# Patient Record
Sex: Female | Born: 1970
Health system: Southern US, Community
[De-identification: ages and names within clinical notes are randomized; demographics above are authoritative.]

## PROBLEM LIST (undated history)

## (undated) DIAGNOSIS — I10 Essential (primary) hypertension: Secondary | ICD-10-CM

## (undated) DIAGNOSIS — F329 Major depressive disorder, single episode, unspecified: Secondary | ICD-10-CM

## (undated) DIAGNOSIS — E349 Endocrine disorder, unspecified: Secondary | ICD-10-CM

## (undated) DIAGNOSIS — E785 Hyperlipidemia, unspecified: Secondary | ICD-10-CM

## (undated) DIAGNOSIS — I340 Nonrheumatic mitral (valve) insufficiency: Secondary | ICD-10-CM

## (undated) DIAGNOSIS — F419 Anxiety disorder, unspecified: Secondary | ICD-10-CM

## (undated) DIAGNOSIS — F32A Depression, unspecified: Secondary | ICD-10-CM

## (undated) DIAGNOSIS — E282 Polycystic ovarian syndrome: Secondary | ICD-10-CM

## (undated) DIAGNOSIS — E119 Type 2 diabetes mellitus without complications: Secondary | ICD-10-CM

## (undated) DIAGNOSIS — T7840XA Allergy, unspecified, initial encounter: Secondary | ICD-10-CM

## (undated) DIAGNOSIS — R011 Cardiac murmur, unspecified: Secondary | ICD-10-CM

## (undated) DIAGNOSIS — G4730 Sleep apnea, unspecified: Secondary | ICD-10-CM

## (undated) DIAGNOSIS — J42 Unspecified chronic bronchitis: Secondary | ICD-10-CM

## (undated) HISTORY — DX: Nonrheumatic mitral (valve) insufficiency: I34.0

## (undated) HISTORY — DX: Essential (primary) hypertension: I10

## (undated) HISTORY — DX: Allergy, unspecified, initial encounter: T78.40XA

## (undated) HISTORY — DX: Hyperlipidemia, unspecified: E78.5

## (undated) HISTORY — DX: Anxiety disorder, unspecified: F41.9

## (undated) HISTORY — DX: Endocrine disorder, unspecified: E34.9

## (undated) HISTORY — DX: Cardiac murmur, unspecified: R01.1

## (undated) HISTORY — DX: Major depressive disorder, single episode, unspecified: F32.9

## (undated) HISTORY — DX: Depression, unspecified: F32.A

## (undated) HISTORY — PX: CHOLECYSTECTOMY: SHX55

---

## 2006-05-03 ENCOUNTER — Encounter: Payer: Self-pay | Admitting: Family Medicine

## 2006-05-03 LAB — CONVERTED CEMR LAB: Hgb A1c MFr Bld: 5.4 %

## 2007-02-13 ENCOUNTER — Ambulatory Visit: Payer: Self-pay | Admitting: Family Medicine

## 2007-02-13 DIAGNOSIS — K5900 Constipation, unspecified: Secondary | ICD-10-CM | POA: Insufficient documentation

## 2007-02-13 DIAGNOSIS — E282 Polycystic ovarian syndrome: Secondary | ICD-10-CM | POA: Insufficient documentation

## 2007-02-20 ENCOUNTER — Encounter: Payer: Self-pay | Admitting: Family Medicine

## 2007-02-20 DIAGNOSIS — E781 Pure hyperglyceridemia: Secondary | ICD-10-CM | POA: Insufficient documentation

## 2014-10-13 ENCOUNTER — Encounter: Payer: Self-pay | Admitting: *Deleted

## 2014-10-13 ENCOUNTER — Emergency Department
Admission: EM | Admit: 2014-10-13 | Discharge: 2014-10-13 | Disposition: A | Discharge status: Home / Self Care | Payer: BLUE CROSS/BLUE SHIELD | Source: Home / Self Care | Attending: Emergency Medicine | Admitting: Emergency Medicine

## 2014-10-13 DIAGNOSIS — J208 Acute bronchitis due to other specified organisms: Secondary | ICD-10-CM | POA: Diagnosis not present

## 2014-10-13 HISTORY — DX: Polycystic ovarian syndrome: E28.2

## 2014-10-13 HISTORY — DX: Type 2 diabetes mellitus without complications: E11.9

## 2014-10-13 MED ORDER — ALBUTEROL SULFATE HFA 108 (90 BASE) MCG/ACT IN AERS: 1.0000 | INHALATION_SPRAY | Freq: Four times a day (QID) | RESPIRATORY_TRACT | Status: DC | PRN

## 2014-10-13 MED ORDER — AZITHROMYCIN 250 MG PO TABS: 250.0000 mg | ORAL_TABLET | Freq: Every day | ORAL | Status: DC

## 2014-10-13 NOTE — ED Provider Notes (Signed)
CSN: [id number]     Arrival date & time 10/13/14  1010 History   First MD Initiated Contact with Patient 10/13/14 1051     Chief Complaint  Patient presents with  . Cough   (Consider location/radiation/quality/duration/timing/severity/associated sxs/prior Treatment) Patient is a 44 y.o. female presenting with cough. The history is provided by the patient. No language interpreter was used.  Cough Cough characteristics:  Productive Sputum characteristics:  Nondescript Severity:  Moderate Onset quality:  Gradual Duration:  3 days Timing:  Constant Progression:  Worsening Chronicity:  New Smoker: no   Context: upper respiratory infection   Relieved by:  Nothing Worsened by:  Nothing tried Ineffective treatments:  None tried Associated symptoms: shortness of breath and sinus congestion     Past Medical History  Diagnosis Date  . Diabetes mellitus without complication   . PCOS (polycystic ovarian syndrome)    Past Surgical History  Procedure Laterality Date  . Cholecystectomy     Family History  Problem Relation Age of Onset  . Heart attack Mother   . Heart attack Father   . Heart failure Brother   . Diabetes Brother    History  Substance Use Topics  . Smoking status: Former Smoker    Quit date: 10/13/2013  . Smokeless tobacco: Never Used  . Alcohol Use: No   OB History    No data available     Review of Systems  Respiratory: Positive for cough and shortness of breath.   All other systems reviewed and are negative.   Allergies  Review of patient's allergies indicates no known allergies.  Home Medications   Prior to Admission medications   Medication Sig Start Date End Date Taking? Authorizing Provider  albuterol (PROVENTIL HFA;VENTOLIN HFA) 108 (90 BASE) MCG/ACT inhaler Inhale 1-2 puffs into the lungs every 6 (six) hours as needed for wheezing or shortness of breath. 10/13/14   Elson Areas, PA-C  azithromycin (ZITHROMAX) 250 MG tablet Take 1 tablet (250 mg  total) by mouth daily. Take first 2 tablets together, then 1 every day until finished. 10/13/14   Elson Areas, PA-C   BP 135/87 mmHg  Pulse 84  Temp(Src) 98 F (36.7 C) (Oral)  Resp 16  Ht  (1.549 m)  Wt 228 lb (103.42 kg)  BMI 43.10 kg/m2  SpO2 99%  LMP 08/07/2014 (Approximate) Physical Exam  Constitutional: She is oriented to person, place, and time. She appears well-developed and well-nourished.  HENT:  Head: Normocephalic and atraumatic.  Right Ear: External ear normal.  Left Ear: External ear normal.  Nose: Nose normal.  Mouth/Throat: Oropharynx is clear and moist.  Eyes: Conjunctivae and EOM are normal. Pupils are equal, round, and reactive to light.  Neck: Normal range of motion.  Cardiovascular: Normal rate and regular rhythm.   Pulmonary/Chest: Effort normal and breath sounds normal.  Abdominal: Soft. She exhibits no distension.  Musculoskeletal: Normal range of motion.  Neurological: She is alert and oriented to person, place, and time.  Skin: Skin is warm.  Psychiatric: She has a normal mood and affect.  Nursing note and vitals reviewed.   ED Course  Procedures (including critical care time) Labs Review Labs Reviewed - No data to display  Imaging Review No results found.   MDM   1. Acute bronchitis due to other specified organisms    Albuterol zithromax Return if any problems. AVS   Elson Areas, PA-C 10/13/14 1400

## 2014-10-13 NOTE — ED Notes (Signed)
Arayah c/o wet non-productive cough, congestion, body aches and sneezing x 3 days.

## 2014-10-13 NOTE — Discharge Instructions (Signed)

## 2014-12-11 ENCOUNTER — Emergency Department
Admission: EM | Admit: 2014-12-11 | Discharge: 2014-12-11 | Disposition: A | Discharge status: Home / Self Care | Payer: BLUE CROSS/BLUE SHIELD | Source: Home / Self Care | Attending: Emergency Medicine | Admitting: Emergency Medicine

## 2014-12-11 ENCOUNTER — Encounter: Payer: Self-pay | Admitting: Emergency Medicine

## 2014-12-11 DIAGNOSIS — H65191 Other acute nonsuppurative otitis media, right ear: Secondary | ICD-10-CM | POA: Diagnosis not present

## 2014-12-11 DIAGNOSIS — H6091 Unspecified otitis externa, right ear: Secondary | ICD-10-CM

## 2014-12-11 DIAGNOSIS — H1031 Unspecified acute conjunctivitis, right eye: Secondary | ICD-10-CM | POA: Diagnosis not present

## 2014-12-11 DIAGNOSIS — H6121 Impacted cerumen, right ear: Secondary | ICD-10-CM

## 2014-12-11 MED ORDER — POLYMYXIN B-TRIMETHOPRIM 10000-0.1 UNIT/ML-% OP SOLN: OPHTHALMIC | Status: DC

## 2014-12-11 MED ORDER — CEFDINIR 300 MG PO CAPS: 300.0000 mg | ORAL_CAPSULE | Freq: Two times a day (BID) | ORAL | Status: DC

## 2014-12-11 MED ORDER — NEOMYCIN-POLYMYXIN-HC 3.5-10000-1 OT SUSP: 4.0000 [drp] | Freq: Three times a day (TID) | OTIC | Status: DC

## 2014-12-11 NOTE — ED Provider Notes (Signed)
CSN: [id number]     Arrival date & time 12/11/14  1828 History   First MD Initiated Contact with Patient 12/11/14 1836     Chief Complaint  Patient presents with  . Otalgia   (Consider location/radiation/quality/duration/timing/severity/associated sxs/prior Treatment) HPI URI HISTORY  Tenise is a 44 y.o. female who complains of onset of various cold symptoms and eye and ear symptoms for one week. Have been using over-the-counter treatment which helps a little bit.  No chills/sweats +  Fever  +  Nasal congestion +  Discolored Post-nasal drainage + mild sinus pain/pressure + mild sore throat  +  cough No wheezing No chest congestion No hemoptysis No shortness of breath No pleuritic pain  + red eyes, especially right with matted discharge + earache bilaterally, ears feel stopped up.  No nausea No vomiting No abdominal pain No diarrhea  No skin rashes +  Fatigue No myalgias No headache   Past Medical History  Diagnosis Date  . Diabetes mellitus without complication   . PCOS (polycystic ovarian syndrome)    Past Surgical History  Procedure Laterality Date  . Cholecystectomy     Family History  Problem Relation Age of Onset  . Heart attack Mother   . Heart attack Father   . Heart failure Brother   . Diabetes Brother    History  Substance Use Topics  . Smoking status: Former Smoker    Quit date: 10/13/2013  . Smokeless tobacco: Never Used  . Alcohol Use: No   OB History    No data available     Review of Systems  All other systems reviewed and are negative.   Allergies  Review of patient's allergies indicates not on file.  Home Medications   Prior to Admission medications   Medication Sig Start Date End Date Taking? Authorizing Provider  albuterol (PROVENTIL HFA;VENTOLIN HFA) 108 (90 BASE) MCG/ACT inhaler Inhale 1-2 puffs into the lungs every 6 (six) hours as needed for wheezing or shortness of breath. 10/13/14   Elson Areas, PA-C  azithromycin  (ZITHROMAX) 250 MG tablet Take 1 tablet (250 mg total) by mouth daily. Take first 2 tablets together, then 1 every day until finished. 10/13/14   Elson Areas, PA-C   BP 134/84 mmHg  Pulse 74  Temp(Src) 98.7 F (37.1 C) (Oral)  Ht  (1.575 m)  Wt 235 lb (106.595 kg)  BMI 42.97 kg/m2  SpO2 99% Physical Exam  Constitutional: She is oriented to person, place, and time. She appears well-developed and well-nourished. No distress.  HENT:  Head: Normocephalic and atraumatic.  Right Ear: Ear canal normal. Tympanic membrane is injected and erythematous.  Left Ear: Tympanic membrane and ear canal normal.  Nose: Mucosal edema and rhinorrhea present.  Mouth/Throat: Oropharynx is clear and moist. No oral lesions.  Posterior pharynx mildly red without exudate Left ear normal. Right ear with cerumen impaction. After lavage,and cerumen impaction was successfully removed, canal was swollen red but patent and right TM red but intact.  Eyes: No scleral icterus.  Conjunctiva mildly injected, slight yellow matter discharge R conjunctiva  Neck: Neck supple.  Cardiovascular: Normal rate, regular rhythm and normal heart sounds.   Pulmonary/Chest: Effort normal and breath sounds normal.  Lymphadenopathy:    She has no cervical adenopathy.  Neurological: She is alert and oriented to person, place, and time.  Skin: Skin is warm and dry.  Nursing note and vitals reviewed.   ED Course  Procedures (including critical care time) Labs Review  Labs Reviewed - No data to display  Imaging Review No results found.   MDM   1. Acute otitis media with effusion, right   2. Otitis external, right   3. Cerumen impaction, right   4. Acute conjunctivitis, right eye    pharyngitis/upper respiratory tract infection.   Treatment options discussed, as well as risks, benefits, alternatives. Patient voiced understanding and agreement with the following plans: Irrigated and lavaged right cerumen impaction with  good results. External canal slightly red and swollen when examined afterward. Right TM intact , red. Discharge Medication List as of 12/11/2014  7:23 PM    START taking these medications   Details  cefdinir (OMNICEF) 300 MG capsule Take 1 capsule (300 mg total) by mouth 2 (two) times daily. X 10 days, Starting 12/11/2014, Until Discontinued, Normal    neomycin-polymyxin-hydrocortisone (CORTISPORIN) 3.5-10000-1 otic suspension Place 4 drops into the right ear 3 (three) times daily., Starting 12/11/2014, Until Discontinued, Normal    trimethoprim-polymyxin b (POLYTRIM) ophthalmic solution 2 drop in affected eye(s) every 4 hours (while awake) x 7 days, Normal       Other symptomatic care discussed Follow-up with your primary care doctor in 5-7 days if not improving, or sooner if symptoms become worse. Precautions discussed. Red flags discussed. Questions invited and answered. Patient voiced understanding and agreement.    Lajean Manesavid Massey, MD 12/12/14 1210

## 2014-12-11 NOTE — ED Notes (Signed)
Rt ear pain, scratchy throat, sneezing x 1 week

## 2015-04-24 DIAGNOSIS — I152 Hypertension secondary to endocrine disorders: Secondary | ICD-10-CM | POA: Insufficient documentation

## 2015-04-24 DIAGNOSIS — E1159 Type 2 diabetes mellitus with other circulatory complications: Secondary | ICD-10-CM | POA: Insufficient documentation

## 2015-04-24 DIAGNOSIS — I10 Essential (primary) hypertension: Secondary | ICD-10-CM

## 2015-05-25 LAB — HM PAP SMEAR: HM Pap smear: NEGATIVE

## 2015-09-21 ENCOUNTER — Encounter: Payer: Self-pay | Admitting: Emergency Medicine

## 2015-09-21 ENCOUNTER — Emergency Department
Admission: EM | Admit: 2015-09-21 | Discharge: 2015-09-21 | Disposition: A | Discharge status: Home / Self Care | Payer: BLUE CROSS/BLUE SHIELD | Source: Home / Self Care | Attending: Family Medicine | Admitting: Family Medicine

## 2015-09-21 DIAGNOSIS — J069 Acute upper respiratory infection, unspecified: Secondary | ICD-10-CM | POA: Diagnosis not present

## 2015-09-21 MED ORDER — DM-GUAIFENESIN ER 30-600 MG PO TB12: 1.0000 | ORAL_TABLET | Freq: Two times a day (BID) | ORAL | Status: DC

## 2015-09-21 MED ORDER — AMOXICILLIN 875 MG PO TABS: 875.0000 mg | ORAL_TABLET | Freq: Two times a day (BID) | ORAL | Status: DC

## 2015-09-21 MED ORDER — FLUTICASONE PROPIONATE 50 MCG/ACT NA SUSP: 2.0000 | Freq: Every day | NASAL | Status: DC

## 2015-09-21 MED ORDER — BENZONATATE 100 MG PO CAPS: 100.0000 mg | ORAL_CAPSULE | Freq: Three times a day (TID) | ORAL | Status: DC

## 2015-09-21 NOTE — ED Provider Notes (Signed)
CSN: [id number]     Arrival date & time 09/21/15  1620 History   First MD Initiated Contact with Patient 09/21/15 1653     Chief Complaint  Patient presents with  . URI   (Consider location/radiation/quality/duration/timing/severity/associated sxs/prior Treatment) HPI Pt is a 44yo female presenting to [facility] with c/o 1 day hx of nasal congestion, sneezing, sinus pain, frontal headache with sinus pressure, sore throat, chills and body aches.  She has tried acetaminophen but no other medications for her symptoms. Pt states her fiance has also been sick but they do not live today.  Pt reports mild intermittent productive cough for 1 day. Nasal congestion and sore throat are most bothersome for patient.  Denies n/v/d. Denies difficulty breathing or swallowing.  Past Medical History  Diagnosis Date  . Diabetes mellitus without complication (HCC)   . PCOS (polycystic ovarian syndrome)    Past Surgical History  Procedure Laterality Date  . Cholecystectomy     Family History  Problem Relation Age of Onset  . Heart attack Mother   . Heart attack Father   . Heart failure Brother   . Diabetes Brother    Social History  Substance Use Topics  . Smoking status: Former Smoker    Quit date: 10/13/2013  . Smokeless tobacco: Never Used  . Alcohol Use: No   OB History    No data available     Review of Systems  Constitutional: Positive for fever and chills.  HENT: Positive for congestion, sinus pressure, sneezing and voice change. Negative for ear pain, sore throat and trouble swallowing.   Respiratory: Negative for cough and shortness of breath.   Cardiovascular: Negative for chest pain and palpitations.  Gastrointestinal: Negative for nausea, vomiting, abdominal pain and diarrhea.  Musculoskeletal: Negative for myalgias, back pain and arthralgias.  Skin: Negative for rash.  Neurological: Positive for headaches. Negative for dizziness and light-headedness.  All other systems reviewed and  are negative.   Allergies  Review of patient's allergies indicates no known allergies.  Home Medications   Prior to Admission medications   Medication Sig Start Date End Date Taking? Authorizing Provider  fenofibrate (TRICOR) 145 MG tablet Take 145 mg by mouth daily.   Yes Historical Provider, MD  lisinopril (PRINIVIL,ZESTRIL) 5 MG tablet Take 5 mg by mouth daily.   Yes Historical Provider, MD  amoxicillin (AMOXIL) 875 MG tablet Take 1 tablet (875 mg total) by mouth 2 (two) times daily. For 7 days 09/21/15   Junius Finner, PA-C  benzonatate (TESSALON) 100 MG capsule Take 1 capsule (100 mg total) by mouth every 8 (eight) hours. 09/21/15   Junius Finner, PA-C  cefdinir (OMNICEF) 300 MG capsule Take 1 capsule (300 mg total) by mouth 2 (two) times daily. X 10 days 12/11/14   Lajean Manes, MD  dextromethorphan-guaiFENesin [facility] DM) 30-600 MG 12hr tablet Take 1 tablet by mouth 2 (two) times daily. 09/21/15   Junius Finner, PA-C  fluticasone (FLONASE) 50 MCG/ACT nasal spray Place 2 sprays into both nostrils daily. 09/21/15   Junius Finner, PA-C  neomycin-polymyxin-hydrocortisone (CORTISPORIN) 3.5-10000-1 otic suspension Place 4 drops into the right ear 3 (three) times daily. 12/11/14   Lajean Manes, MD  trimethoprim-polymyxin b (POLYTRIM) ophthalmic solution 2 drop in affected eye(s) every 4 hours (while awake) x 7 days 12/11/14   Lajean Manes, MD   Meds Ordered and Administered this Visit  Medications - No data to display  BP 131/82 mmHg  Pulse 87  Temp(Src) 98.3 F (36.8 C) (Oral)  Ht 5\' 2"  (1.575 m)  Wt 256 lb (116.121 kg)  BMI 46.81 kg/m2  SpO2 99%  LMP 05/22/2015 No data found.   Physical Exam  Constitutional: She appears well-developed and well-nourished. No distress.  HENT:  Head: Normocephalic and atraumatic.  Right Ear: Hearing, tympanic membrane, external ear and ear canal normal.  Left Ear: Hearing, tympanic membrane, external ear and ear canal normal.  Nose: Mucosal edema  present. Right sinus exhibits frontal sinus tenderness. Right sinus exhibits no maxillary sinus tenderness. Left sinus exhibits frontal sinus tenderness. Left sinus exhibits no maxillary sinus tenderness.  Mouth/Throat: Uvula is midline, oropharynx is clear and moist and mucous membranes are normal.  Eyes: Conjunctivae are normal. No scleral icterus.  Neck: Normal range of motion. Neck supple.  Cardiovascular: Normal rate, regular rhythm and normal heart sounds.   Pulmonary/Chest: Effort normal and breath sounds normal. No respiratory distress. She has no wheezes. She has no rales. She exhibits no tenderness.  Abdominal: Soft. She exhibits no distension and no mass. There is no tenderness. There is no rebound and no guarding.  Musculoskeletal: Normal range of motion.  Lymphadenopathy:    She has no cervical adenopathy.  Neurological: She is alert.  Skin: Skin is warm and dry. She is not diaphoretic.  Nursing note and vitals reviewed.   ED Course  Procedures (including critical care time)  Labs Review Labs Reviewed - No data to display  Imaging Review No results found.    MDM   1. Acute upper respiratory infection    Pt c/o URI symptoms for 1 day. She has tried acetaminophen but no other home treatments. Pt is afebrile. Symptoms likely viral in nature.  Encouraged pt to try symptomatic treatment for at least 4-5 days, may fill antibiotic if symptoms continue to worsen.  Rx: Tessalon, Flonase, and Mucinex DM were e-prescribed.  Printed- amoxicillin 875mg , may fill in 1 week if symptoms continue to worsen.  Advised pt to use acetaminophen and ibuprofen as needed for fever and pain. Encouraged rest and fluids. F/u with PCP in 7-10 days if not improving, sooner if worsening. Pt verbalized understanding and agreement with tx plan.     Junius Finnerrin O'Malley, PA-C 09/21/15 1735

## 2015-09-21 NOTE — Discharge Instructions (Signed)
You may take 400-600mg  Ibuprofen (Motrin) every 6-8 hours for fever and pain  Alternate with Tylenol  You may take 500mg  Tylenol every 4-6 hours as needed for fever and pain  Follow-up with your primary care provider next week for recheck of symptoms if not improving.  Be sure to drink plenty of fluids and rest, at least 8hrs of sleep a night, preferably more while you are sick. Return urgent care or go to closest ER if you cannot keep down fluids/signs of dehydration, fever not reducing with Tylenol, difficulty breathing/wheezing, stiff neck, worsening condition, or other concerns (see below)   Please allow at least 4-5 days of symptomatic treatment to be started before filling antibiotic.  If symptoms continue to worsen, you may fill the antibiotic.   If you decide to fill the antibiotic, Please take antibiotics as prescribed and be sure to complete entire course even if you start to feel better to ensure infection does not come back.  Cool Mist Vaporizers Vaporizers may help relieve the symptoms of a cough and cold. They add moisture to the air, which helps mucus to become thinner and less sticky. This makes it easier to breathe and cough up secretions. Cool mist vaporizers do not cause serious burns like hot mist vaporizers, which may also be called steamers or humidifiers. Vaporizers have not been proven to help with colds. You should not use a vaporizer if you are allergic to mold. HOME CARE INSTRUCTIONS  Follow the package instructions for the vaporizer.  Do not use anything other than distilled water in the vaporizer.  Do not run the vaporizer all of the time. This can cause mold or bacteria to grow in the vaporizer.  Clean the vaporizer after each time it is used.  Clean and dry the vaporizer well before storing it.  Stop using the vaporizer if worsening respiratory symptoms develop.   This information is not intended to replace advice given to you by your health care provider.  Make sure you discuss any questions you have with your health care provider.   Document Released: 06/23/2004 Document Revised: 10/01/2013 Document Reviewed: 02/13/2013 Elsevier Interactive Patient Education [employer]2016 [employer]

## 2015-09-21 NOTE — ED Notes (Signed)
Cough, sneezing, congestion, sinus pain, pressure, headache, sore throat, chills, body aches started yesterday

## 2015-12-18 DIAGNOSIS — M25061 Hemarthrosis, right knee: Secondary | ICD-10-CM | POA: Insufficient documentation

## 2015-12-30 DIAGNOSIS — S83004D Unspecified dislocation of right patella, subsequent encounter: Secondary | ICD-10-CM | POA: Insufficient documentation

## 2016-01-13 DIAGNOSIS — M25561 Pain in right knee: Secondary | ICD-10-CM | POA: Diagnosis not present

## 2016-01-19 DIAGNOSIS — S83004D Unspecified dislocation of right patella, subsequent encounter: Secondary | ICD-10-CM | POA: Diagnosis not present

## 2016-01-20 DIAGNOSIS — M25561 Pain in right knee: Secondary | ICD-10-CM | POA: Diagnosis not present

## 2016-01-27 DIAGNOSIS — M25561 Pain in right knee: Secondary | ICD-10-CM | POA: Diagnosis not present

## 2016-02-18 DIAGNOSIS — S83004D Unspecified dislocation of right patella, subsequent encounter: Secondary | ICD-10-CM | POA: Diagnosis not present

## 2016-03-13 DIAGNOSIS — M25541 Pain in joints of right hand: Secondary | ICD-10-CM | POA: Diagnosis not present

## 2016-03-13 DIAGNOSIS — Z79899 Other long term (current) drug therapy: Secondary | ICD-10-CM | POA: Diagnosis not present

## 2016-03-13 DIAGNOSIS — G5601 Carpal tunnel syndrome, right upper limb: Secondary | ICD-10-CM | POA: Diagnosis not present

## 2016-03-13 DIAGNOSIS — Z791 Long term (current) use of non-steroidal anti-inflammatories (NSAID): Secondary | ICD-10-CM | POA: Diagnosis not present

## 2016-03-13 DIAGNOSIS — R2231 Localized swelling, mass and lump, right upper limb: Secondary | ICD-10-CM | POA: Diagnosis not present

## 2016-03-13 DIAGNOSIS — Z87891 Personal history of nicotine dependence: Secondary | ICD-10-CM | POA: Diagnosis not present

## 2016-03-13 DIAGNOSIS — M7989 Other specified soft tissue disorders: Secondary | ICD-10-CM | POA: Diagnosis not present

## 2016-03-13 DIAGNOSIS — S6991XA Unspecified injury of right wrist, hand and finger(s), initial encounter: Secondary | ICD-10-CM | POA: Diagnosis not present

## 2016-03-13 DIAGNOSIS — E119 Type 2 diabetes mellitus without complications: Secondary | ICD-10-CM | POA: Diagnosis not present

## 2016-03-19 ENCOUNTER — Encounter: Payer: Self-pay | Admitting: Emergency Medicine

## 2016-03-19 ENCOUNTER — Emergency Department
Admission: EM | Admit: 2016-03-19 | Discharge: 2016-03-19 | Disposition: A | Discharge status: Home / Self Care | Payer: BLUE CROSS/BLUE SHIELD | Source: Home / Self Care | Attending: Family Medicine | Admitting: Family Medicine

## 2016-03-19 DIAGNOSIS — J069 Acute upper respiratory infection, unspecified: Secondary | ICD-10-CM

## 2016-03-19 DIAGNOSIS — B9789 Other viral agents as the cause of diseases classified elsewhere: Principal | ICD-10-CM

## 2016-03-19 HISTORY — DX: Sleep apnea, unspecified: G47.30

## 2016-03-19 MED ORDER — PREDNISONE 20 MG PO TABS: ORAL_TABLET | ORAL | Status: DC

## 2016-03-19 MED ORDER — BENZONATATE 200 MG PO CAPS: 200.0000 mg | ORAL_CAPSULE | Freq: Every day | ORAL | Status: DC

## 2016-03-19 MED ORDER — CEFDINIR 300 MG PO CAPS: 300.0000 mg | ORAL_CAPSULE | Freq: Two times a day (BID) | ORAL | Status: DC

## 2016-03-19 MED ORDER — ALBUTEROL SULFATE HFA 108 (90 BASE) MCG/ACT IN AERS: 2.0000 | INHALATION_SPRAY | RESPIRATORY_TRACT | Status: DC | PRN

## 2016-03-19 NOTE — ED Notes (Signed)
Pt c/o runny nose, sneezing, head congestion, sinus pressure which started last night.

## 2016-03-19 NOTE — ED Provider Notes (Signed)
CSN: [id number]     Arrival date & time 03/19/16  1042 History   First MD Initiated Contact with Patient 03/19/16 1147     Chief Complaint  Patient presents with  . URI      HPI Comments: Last night patient developed sore throat, sinus congestion, fatigue, and sweats.  This morning she awoke with a cough and her right ear feels clogged. She has a history of seasonal allergies, and frequent bronchitis.  She had pneumonia two years ago.  The history is provided by the patient.    Past Medical History  Diagnosis Date  . Diabetes mellitus without complication (HCC)   . PCOS (polycystic ovarian syndrome)   . Sleep apnea    Past Surgical History  Procedure Laterality Date  . Cholecystectomy     Family History  Problem Relation Age of Onset  . Heart attack Mother   . Heart attack Father   . Heart failure Brother   . Diabetes Brother    Social History  Substance Use Topics  . Smoking status: Former Smoker    Quit date: 10/13/2013  . Smokeless tobacco: Never Used  . Alcohol Use: No   OB History    No data available     Review of Systems + sore throat + cough + sneezing No pleuritic pain No wheezing + nasal congestion + post-nasal drainage No sinus pain/pressure No itchy/red eyes ? earache No hemoptysis No SOB No fever; + sweats No nausea No vomiting No abdominal pain No diarrhea No urinary symptoms No skin rash + fatigue No myalgias + headache Used OTC meds without relief  Allergies  Review of patient's allergies indicates no known allergies.  Home Medications   Prior to Admission medications   Medication Sig Start Date End Date Taking? Authorizing Provider  albuterol (PROVENTIL HFA;VENTOLIN HFA) 108 (90 Base) MCG/ACT inhaler Inhale 2 puffs into the lungs every 4 (four) hours as needed for wheezing or shortness of breath. 03/19/16   Lattie HawStephen A Willer Osorno, MD  benzonatate (TESSALON) 200 MG capsule Take 1 capsule (200 mg total) by mouth at bedtime. Take as  needed for cough 03/19/16   Lattie HawStephen A Machaela Caterino, MD  cefdinir (OMNICEF) 300 MG capsule Take 1 capsule (300 mg total) by mouth 2 (two) times daily. 03/19/16   Lattie HawStephen A Cache Bills, MD  predniSONE (DELTASONE) 20 MG tablet Take one tab by mouth twice daily for 5 days, then one daily for 3 days. Take with food. 03/19/16   Lattie HawStephen A Aurianna Earlywine, MD   Meds Ordered and Administered this Visit  Medications - No data to display  BP 137/83 mmHg  Pulse 95  Temp(Src) 98.6 F (37 C) (Oral)  Ht 5' 5.5" (1.664 m)  Wt 263 lb 8 oz (119.523 kg)  BMI 43.17 kg/m2  SpO2 95%  LMP 01/22/2016 No data found.   Physical Exam Nursing notes and Vital Signs reviewed. Appearance:  Patient appears stated age, and in no acute distress Eyes:  Pupils are equal, round, and reactive to light and accomodation.  Extraocular movement is intact.  Conjunctivae are not inflamed  Ears:  Canals normal.  Tympanic membranes normal.  Nose:  Mildly congested turbinates.  No sinus tenderness.   Pharynx:  Normal Neck:  Supple.  Tender enlarged posterior/lateral nodes are palpated bilaterally  Lungs:  Clear to auscultation.  Breath sounds are equal.  Moving air well. Heart:  Regular rate and rhythm without murmurs, rubs, or gallops.  Abdomen:  Nontender without masses or hepatosplenomegaly.  Bowel  sounds are present.  No CVA or flank tenderness.  Extremities:  No edema.  Skin:  No rash present.   ED Course  Procedures none    MDM   1. Viral URI with cough    Begin prednisone burst/taper, and Omnicef  BID Prescription written for Benzonatate (Tessalon) to take at bedtime for night-time cough.  Take plain guaifenesin (  extended release tabs such as Mucinex) twice daily, with plenty of water, for cough and congestion.  May add Pseudoephedrine ( , one or two every 4 to 6 hours) for sinus congestion.  Get adequate rest.   May use Afrin nasal spray (or generic oxymetazoline) twice daily for about 5 days and then discontinue.  Also  recommend using saline nasal spray several times daily and saline nasal irrigation (AYR is a common brand).  Use Flonase nasal spray each morning after using Afrin nasal spray and saline nasal irrigation. Try warm salt water gargles for sore throat.  Stop all antihistamines for now, and other non-prescription cough/cold preparations. May begin albuterol inhaler if wheezing develops. Follow-up with family doctor if not improving about10 days.     Lattie Haw, MD 03/27/16 1114

## 2016-03-19 NOTE — Discharge Instructions (Signed)
Take plain guaifenesin (1200mg  extended release tabs such as Mucinex) twice daily, with plenty of water, for cough and congestion.  May add Pseudoephedrine (30mg , one or two every 4 to 6 hours) for sinus congestion.  Get adequate rest.   May use Afrin nasal spray (or generic oxymetazoline) twice daily for about 5 days and then discontinue.  Also recommend using saline nasal spray several times daily and saline nasal irrigation (AYR is a common brand).  Use Flonase nasal spray each morning after using Afrin nasal spray and saline nasal irrigation. Try warm salt water gargles for sore throat.  Stop all antihistamines for now, and other non-prescription cough/cold preparations. May begin albuterol inhaler if wheezing develops. Follow-up with family doctor if not improving about10 days.

## 2016-03-24 DIAGNOSIS — R739 Hyperglycemia, unspecified: Secondary | ICD-10-CM | POA: Diagnosis not present

## 2016-03-24 DIAGNOSIS — J209 Acute bronchitis, unspecified: Secondary | ICD-10-CM | POA: Diagnosis not present

## 2016-03-27 DIAGNOSIS — J069 Acute upper respiratory infection, unspecified: Secondary | ICD-10-CM | POA: Diagnosis not present

## 2016-03-27 DIAGNOSIS — Z8701 Personal history of pneumonia (recurrent): Secondary | ICD-10-CM | POA: Diagnosis not present

## 2016-03-27 DIAGNOSIS — R509 Fever, unspecified: Secondary | ICD-10-CM | POA: Diagnosis not present

## 2016-03-27 DIAGNOSIS — R05 Cough: Secondary | ICD-10-CM | POA: Diagnosis not present

## 2016-03-27 DIAGNOSIS — R0602 Shortness of breath: Secondary | ICD-10-CM | POA: Diagnosis not present

## 2016-03-27 DIAGNOSIS — E119 Type 2 diabetes mellitus without complications: Secondary | ICD-10-CM | POA: Diagnosis not present

## 2016-03-27 DIAGNOSIS — Z8709 Personal history of other diseases of the respiratory system: Secondary | ICD-10-CM | POA: Diagnosis not present

## 2016-03-30 DIAGNOSIS — J209 Acute bronchitis, unspecified: Secondary | ICD-10-CM | POA: Diagnosis not present

## 2016-03-31 DIAGNOSIS — J209 Acute bronchitis, unspecified: Secondary | ICD-10-CM | POA: Diagnosis not present

## 2016-04-06 DIAGNOSIS — J209 Acute bronchitis, unspecified: Secondary | ICD-10-CM | POA: Diagnosis not present

## 2016-05-22 DIAGNOSIS — R52 Pain, unspecified: Secondary | ICD-10-CM | POA: Diagnosis not present

## 2016-05-22 DIAGNOSIS — E119 Type 2 diabetes mellitus without complications: Secondary | ICD-10-CM | POA: Diagnosis not present

## 2016-05-22 DIAGNOSIS — M47816 Spondylosis without myelopathy or radiculopathy, lumbar region: Secondary | ICD-10-CM | POA: Diagnosis not present

## 2016-05-22 DIAGNOSIS — M5441 Lumbago with sciatica, right side: Secondary | ICD-10-CM | POA: Diagnosis not present

## 2016-05-22 DIAGNOSIS — M419 Scoliosis, unspecified: Secondary | ICD-10-CM | POA: Diagnosis not present

## 2016-05-22 DIAGNOSIS — M47817 Spondylosis without myelopathy or radiculopathy, lumbosacral region: Secondary | ICD-10-CM | POA: Diagnosis not present

## 2016-10-04 ENCOUNTER — Emergency Department
Admission: EM | Admit: 2016-10-04 | Discharge: 2016-10-04 | Disposition: A | Discharge status: Home / Self Care | Payer: BLUE CROSS/BLUE SHIELD | Source: Home / Self Care | Attending: Family Medicine | Admitting: Family Medicine

## 2016-10-04 ENCOUNTER — Encounter: Payer: Self-pay | Admitting: *Deleted

## 2016-10-04 ENCOUNTER — Emergency Department (INDEPENDENT_AMBULATORY_CARE_PROVIDER_SITE_OTHER): Payer: BLUE CROSS/BLUE SHIELD

## 2016-10-04 DIAGNOSIS — J069 Acute upper respiratory infection, unspecified: Secondary | ICD-10-CM

## 2016-10-04 DIAGNOSIS — R6889 Other general symptoms and signs: Secondary | ICD-10-CM

## 2016-10-04 DIAGNOSIS — R05 Cough: Secondary | ICD-10-CM | POA: Diagnosis not present

## 2016-10-04 DIAGNOSIS — J9811 Atelectasis: Secondary | ICD-10-CM

## 2016-10-04 MED ORDER — IPRATROPIUM BROMIDE 0.06 % NA SOLN: 2.0000 | Freq: Four times a day (QID) | NASAL | 0 refills | Status: DC

## 2016-10-04 MED ORDER — ALBUTEROL SULFATE HFA 108 (90 BASE) MCG/ACT IN AERS: 1.0000 | INHALATION_SPRAY | Freq: Four times a day (QID) | RESPIRATORY_TRACT | 0 refills | Status: DC | PRN

## 2016-10-04 MED ORDER — PREDNISONE 20 MG PO TABS: ORAL_TABLET | ORAL | 0 refills | Status: DC

## 2016-10-04 MED ORDER — BENZONATATE 100 MG PO CAPS: 100.0000 mg | ORAL_CAPSULE | Freq: Three times a day (TID) | ORAL | 0 refills | Status: DC

## 2016-10-04 MED ORDER — AZITHROMYCIN 250 MG PO TABS: 250.0000 mg | ORAL_TABLET | Freq: Every day | ORAL | 0 refills | Status: DC

## 2016-10-04 NOTE — ED Triage Notes (Signed)
Patient c/o dry cough, congestion, aches, chills/sweats. Taken day/nquil otc.

## 2016-10-04 NOTE — Discharge Instructions (Signed)
°  For the Ipratropium nasal spray, be sure to use as prescribed to help prevent post-nasal drip, which can trigger coughing, especially at night. ° °Use 2 sprays per nostril 4 times daily while sick.  You should spray one time in each nostril pointing the spray to the out portion of your nostril, breath in slowly while spraying. Wait about 30 seconds to 1 minute before given the second spray in each nostril.  This will ensure you get the most benefit from each spray.   ° °Your symptoms are likely due to a virus such as the common cold, however, if you developing worsening chest congestion with shortness of breath, persistent fever for 3 days, or symptoms not improving in 4-5 days, you may fill the antibiotic (azithromycin).  If you do fill the antibiotic,  please take antibiotics as prescribed and be sure to complete entire course even if you start to feel better to ensure infection does not come back. ° °

## 2016-10-04 NOTE — ED Provider Notes (Signed)
CSN: [id number]     Arrival date & time 10/04/16  1639 History   First MD Initiated Contact with Patient 10/04/16 1706     Chief Complaint  Patient presents with  . Cough  . Nasal Congestion  . Chills   (Consider location/radiation/quality/duration/timing/severity/associated sxs/prior Treatment) HPI  Mary Clarke is a 45 y.o. female presenting to UC with c/o 3-4 days of mild intermittent dry cough with nasal congestion, body aches, chills and sweats.  She has tried Dayquil and Nyquil with mild temporary relief. Hx of bronchitis in the past.  Others around her have been sick.  She did not get the flu vaccine this season but has been around others with the flu.    Past Medical History:  Diagnosis Date  . Diabetes mellitus without complication (HCC)   . PCOS (polycystic ovarian syndrome)   . Sleep apnea    Past Surgical History:  Procedure Laterality Date  . CHOLECYSTECTOMY     Family History  Problem Relation Age of Onset  . Heart attack Mother   . Heart attack Father   . Heart failure Brother   . Diabetes Brother    Social History  Substance Use Topics  . Smoking status: Former Smoker    Quit date: 10/13/2013  . Smokeless tobacco: Never Used  . Alcohol use No   OB History    No data available     Review of Systems  Constitutional: Positive for chills and fatigue. Negative for fever.  HENT: Positive for congestion, postnasal drip, rhinorrhea, sinus pain, sinus pressure and sore throat. Negative for ear pain, trouble swallowing and voice change.   Respiratory: Positive for cough and wheezing. Negative for shortness of breath.   Cardiovascular: Negative for chest pain and palpitations.  Gastrointestinal: Negative for abdominal pain, diarrhea, nausea and vomiting.  Musculoskeletal: Negative for arthralgias, back pain and myalgias.  Skin: Negative for rash.    Allergies  Patient has no known allergies.  Home Medications   Prior to Admission medications   Medication  Sig Start Date End Date Taking? Authorizing Provider  albuterol (PROVENTIL HFA;VENTOLIN HFA) 108 (90 Base) MCG/ACT inhaler Inhale 1-2 puffs into the lungs every 6 (six) hours as needed for wheezing or shortness of breath. 10/04/16   Junius FinnerErin O'Malley, PA-C  azithromycin (ZITHROMAX) 250 MG tablet Take 1 tablet (250 mg total) by mouth daily. Take first 2 tablets together, then 1 every day until finished. 10/04/16   Junius FinnerErin O'Malley, PA-C  benzonatate (TESSALON) 100 MG capsule Take 1-2 capsules (100-200 mg total) by mouth every 8 (eight) hours. 10/04/16   Junius FinnerErin O'Malley, PA-C  ipratropium (ATROVENT) 0.06 % nasal spray Place 2 sprays into both nostrils 4 (four) times daily. For 1 week 10/04/16   Junius FinnerErin O'Malley, PA-C  predniSONE (DELTASONE) 20 MG tablet 3 tabs po day one, then 2 po daily x 4 days 10/04/16   Junius FinnerErin O'Malley, PA-C   Meds Ordered and Administered this Visit  Medications - No data to display  BP 100/67 (BP Location: Left Arm)   Pulse 85   Temp 98.8 F (37.1 C) (Oral)   Resp 16   Wt 258 lb (117 kg)   SpO2 94%   BMI 42.28 kg/m  No data found.   Physical Exam  Constitutional: She appears well-developed and well-nourished. No distress.  HENT:  Head: Normocephalic and atraumatic.  Right Ear: Tympanic membrane normal.  Left Ear: Tympanic membrane normal.  Nose: Nose normal.  Mouth/Throat: Uvula is midline, oropharynx is clear and moist  and mucous membranes are normal.  Eyes: Conjunctivae are normal. No scleral icterus.  Neck: Normal range of motion. Neck supple.  Cardiovascular: Normal rate, regular rhythm and normal heart sounds.   Pulmonary/Chest: Effort normal. No stridor. No respiratory distress. She has decreased breath sounds in the right lower field and the left lower field. She has wheezes in the right lower field and the left lower field. She has rhonchi. She has no rales. She exhibits no tenderness.  Musculoskeletal: Normal range of motion.  Lymphadenopathy:    She has no  cervical adenopathy.  Neurological: She is alert.  Skin: Skin is warm and dry. She is not diaphoretic.  Nursing note and vitals reviewed.   Urgent Care Course   Clinical Course     Procedures (including critical care time)  Labs Review Labs Reviewed - No data to display  Imaging Review Dg Chest 2 View  Result Date: 10/04/2016 CLINICAL DATA:  Cough, congestion and fatigue with nausea for 4 days. Wheezing and decreased breath sounds in lower lungs. EXAM: CHEST  2 VIEW COMPARISON:  None. FINDINGS: The heart size and mediastinal contours are within normal limits. No pneumonic consolidation or CHF. No effusion or pneumothorax. Minimal bibasilar atelectasis. Degenerate change noted along the visualized dorsal spine with mild multilevel disc space narrowing and osteophytes. IMPRESSION: No active cardiopulmonary disease.  Minimal bibasilar atelectasis. Electronically Signed   By: Tollie Ethavid  Kwon M.D.   On: 10/04/2016 17:37    MDM   1. Acute upper respiratory infection   2. Flu-like symptoms    Pt c/o flu-like symptoms for 3-4 days. Wheeze and decreased breath sounds in lower lung fields.  Pt outside recommended 48 hour treatment window for Tamiflu.   CXR: no active cardiopulmonary disease, minimal bibasilar atelectasis.   Rx: prednisone and albuterol. Prescription to hold for azithromycin. Pt to fill if not improving within 4-5 days of prednisone.  F/u with PCP in 1 week if not improving. Patient verbalized understanding and agreement with treatment plan.     Junius Finnerrin O'Malley, PA-C 10/04/16 1755

## 2016-10-31 ENCOUNTER — Encounter: Payer: Self-pay | Admitting: *Deleted

## 2016-10-31 ENCOUNTER — Emergency Department
Admission: EM | Admit: 2016-10-31 | Discharge: 2016-10-31 | Disposition: A | Discharge status: Home / Self Care | Payer: BLUE CROSS/BLUE SHIELD | Source: Home / Self Care | Attending: Family Medicine | Admitting: Family Medicine

## 2016-10-31 DIAGNOSIS — H6122 Impacted cerumen, left ear: Secondary | ICD-10-CM

## 2016-10-31 DIAGNOSIS — H9202 Otalgia, left ear: Secondary | ICD-10-CM

## 2016-10-31 DIAGNOSIS — H9312 Tinnitus, left ear: Secondary | ICD-10-CM

## 2016-10-31 DIAGNOSIS — M7711 Lateral epicondylitis, right elbow: Secondary | ICD-10-CM

## 2016-10-31 MED ORDER — PREDNISONE 20 MG PO TABS: ORAL_TABLET | ORAL | 0 refills | Status: DC

## 2016-10-31 MED ORDER — AMOXICILLIN-POT CLAVULANATE 875-125 MG PO TABS: 1.0000 | ORAL_TABLET | Freq: Two times a day (BID) | ORAL | 0 refills | Status: DC

## 2016-10-31 NOTE — ED Provider Notes (Signed)
CSN: [id number]     Arrival date & time 10/31/16  1347 History   First MD [id number] Contact with Patient 10/31/16 1426     Chief Complaint  Patient presents with  . Tinnitus  . Mass    RFA   (Consider location/radiation/quality/duration/timing/severity/associated sxs/prior Treatment) HPI  Mary Clarke is a 46 y.o. female presenting to UC with two different complaints. Pt c/o ringing in her Left ear for about 2 weeks.  She has had mild soreness in her ear the last 1-2 days.  Denies cough or congestion. She used peroxide as she initially thought it was due to earwax but no relief.  Denies fever, chills, n/v/d.    Pt also c/o Right forearm pain and has felt several knots under her skin.  She has noticed weakness in the grip of her Right hand.  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  Denies known injury.    Past Medical History:  Diagnosis Date  . Diabetes mellitus without complication (HCC)   . PCOS (polycystic ovarian syndrome)   . Sleep apnea    Past Surgical History:  Procedure Laterality Date  . CHOLECYSTECTOMY     Family History  Problem Relation Age of Onset  . Heart attack Mother   . Heart attack Father   . Heart failure Brother   . Diabetes Brother    Social History  Substance Use Topics  . Smoking status: Former Smoker    Quit date: 10/13/2013  . Smokeless tobacco: Never Used  . Alcohol use No   OB History    No data available     Review of Systems  Constitutional: Negative for chills and fever.  HENT: Positive for ear pain and tinnitus (Left ear). Negative for rhinorrhea. Hearing loss:  mild Left ear.   Musculoskeletal: Positive for arthralgias and myalgias.       Right forearm  Skin: Negative for color change and wound.  Neurological: Positive for weakness (Right grip strength). Negative for dizziness, syncope, light-headedness and headaches.    Allergies  Patient has no known allergies.  Home Medications   Prior to  Admission medications   Medication Sig Start Date End Date Taking? Authorizing Provider  amoxicillin-clavulanate (AUGMENTIN) 875-125 MG tablet Take 1 tablet by mouth 2 (two) times daily. One po bid x 7 days 10/31/16   Junius FinnerErin O'Malley, PA-C  predniSONE (DELTASONE) 20 MG tablet 3 tabs po day one, then 2 po daily x 4 days 10/31/16   Junius FinnerErin O'Malley, PA-C   Meds Ordered and Administered this Visit  Medications - No data to display  BP 122/78 (BP Location: Left Arm)   Pulse 82   Temp 98.3 F (36.8 C) (Oral)   Resp 16   SpO2 97%  No data found.   Physical Exam  Constitutional: She is oriented to person, place, and time. She appears well-developed and well-nourished. No distress.  HENT:  Head: Normocephalic and atraumatic.  Right Ear: Tympanic membrane normal.  Left Ear: Tympanic membrane is erythematous. Tympanic membrane is not bulging.  Nose: Nose normal.  Mouth/Throat: Uvula is midline, oropharynx is clear and moist and mucous membranes are normal.  Left ear: cerumen impaction Left TM after cerumen removal- erythema to TM  Eyes: EOM are normal.  Neck: Normal range of motion. Neck supple.  Cardiovascular: Normal rate.   Pulses:      Radial pulses are 2+ on the right side.  Pulmonary/Chest: Effort normal. No stridor.  Musculoskeletal: Normal  range of motion. She exhibits tenderness. She exhibits no edema.  Right forearm: mild tenderness to Right lateral epicondyle over tendon. "knots" palpated by patient c/w underlying muscle and tendons, same on Left forearm.  Full ROM elbow and wrist.   Lymphadenopathy:    She has no cervical adenopathy.  Neurological: She is alert and oriented to person, place, and time.  Skin: Skin is warm and dry. Capillary refill takes less than 2 seconds. No rash noted. She is not diaphoretic. No erythema.  Psychiatric: She has a normal mood and affect. Her behavior is normal.  Nursing note and vitals reviewed.   Urgent Care Course     Procedures (including  critical care time)  Labs Review Labs Reviewed - No data to display  Imaging Review No results found.   MDM   1. Left ear pain   2. Tinnitus of left ear   3. Lateral epicondylitis of right elbow   4. Impacted cerumen of left ear    Pt c/o tinnitus in Left ear and Right forearm soreness.  Exam c/w cerumen impaction in Left ear. Erythema to TM, possibly from irrigation vs early AOM. Rx: Prednisone and augmentin   Right forearm hx and exam c/w lateral epicondylitis. Tennis elbow strap applied. Pt notes some improved comfort with the strap.   F/u with PCP in 1 week if not improving.     Junius Finner, PA-C 10/31/16 1549

## 2016-10-31 NOTE — ED Triage Notes (Signed)
Patient c/o ringing in her left ear x 2 weeks. Also c/o about 2 weeks of knots in her right FA. At times she feels as if it affects her grip in her right hand.

## 2016-11-15 ENCOUNTER — Encounter: Payer: Self-pay | Admitting: Physician Assistant

## 2016-11-15 ENCOUNTER — Ambulatory Visit (INDEPENDENT_AMBULATORY_CARE_PROVIDER_SITE_OTHER): Payer: BLUE CROSS/BLUE SHIELD | Admitting: Physician Assistant

## 2016-11-15 ENCOUNTER — Other Ambulatory Visit: Payer: Self-pay

## 2016-11-15 VITALS — BP 150/93 | HR 82 | Ht 62.0 in | Wt 255.0 lb

## 2016-11-15 DIAGNOSIS — E119 Type 2 diabetes mellitus without complications: Secondary | ICD-10-CM | POA: Diagnosis not present

## 2016-11-15 DIAGNOSIS — E349 Endocrine disorder, unspecified: Secondary | ICD-10-CM

## 2016-11-15 DIAGNOSIS — I152 Hypertension secondary to endocrine disorders: Secondary | ICD-10-CM

## 2016-11-15 DIAGNOSIS — H9312 Tinnitus, left ear: Secondary | ICD-10-CM

## 2016-11-15 DIAGNOSIS — E282 Polycystic ovarian syndrome: Secondary | ICD-10-CM

## 2016-11-15 DIAGNOSIS — E781 Pure hyperglyceridemia: Secondary | ICD-10-CM | POA: Diagnosis not present

## 2016-11-15 DIAGNOSIS — G4733 Obstructive sleep apnea (adult) (pediatric): Secondary | ICD-10-CM | POA: Diagnosis not present

## 2016-11-15 DIAGNOSIS — F331 Major depressive disorder, recurrent, moderate: Secondary | ICD-10-CM

## 2016-11-15 LAB — CBC
HEMATOCRIT: 46.8 % — AB (ref 35.0–45.0)
HEMOGLOBIN: 15.7 g/dL — AB (ref 11.7–15.5)
MCH: 30.6 pg (ref 27.0–33.0)
MCHC: 33.5 g/dL (ref 32.0–36.0)
MCV: 91.2 fL (ref 80.0–100.0)
MPV: 11.3 fL (ref 7.5–12.5)
Platelets: 228 10*3/uL (ref 140–400)
RBC: 5.13 MIL/uL — ABNORMAL HIGH (ref 3.80–5.10)
RDW: 13.7 % (ref 11.0–15.0)
WBC: 8.7 10*3/uL (ref 3.8–10.8)

## 2016-11-15 LAB — POCT GLYCOSYLATED HEMOGLOBIN (HGB A1C): HEMOGLOBIN A1C: 8

## 2016-11-15 MED ORDER — LISINOPRIL 10 MG PO TABS: 10.0000 mg | ORAL_TABLET | Freq: Every day | ORAL | 0 refills | Status: DC

## 2016-11-15 MED ORDER — METFORMIN HCL 500 MG PO TABS: 500.0000 mg | ORAL_TABLET | Freq: Two times a day (BID) | ORAL | 0 refills | Status: DC

## 2016-11-15 NOTE — Patient Instructions (Addendum)
Carbohydrate Counting for Diabetes Mellitus, Adult Carbohydrate counting is a method for keeping track of how many carbohydrates you eat. Eating carbohydrates naturally increases the amount of sugar (glucose) in the blood. Counting how many carbohydrates you eat helps keep your blood glucose within normal limits, which helps you manage your diabetes (diabetes mellitus). It is important to know how many carbohydrates you can safely have in each meal. This is different for every person. A diet and nutrition specialist (registered dietitian) can help you make a meal plan and calculate how many carbohydrates you should have at each meal and snack. Carbohydrates are found in the following foods:  Grains, such as breads and cereals.  Dried beans and soy products.  Starchy vegetables, such as potatoes, peas, and corn.  Fruit and fruit juices.  Milk and yogurt.  Sweets and snack foods, such as cake, cookies, candy, chips, and soft drinks. How do I count carbohydrates? There are two ways to count carbohydrates in food. You can use either of the methods or a combination of both. Reading "Nutrition Facts" on packaged food  The "Nutrition Facts" list is included on the labels of almost all packaged foods and beverages in the [place] It includes:  The serving size.  Information about nutrients in each serving, including the grams (g) of carbohydrate per serving. To use the "Nutrition Facts":  Decide how many servings you will have.  Multiply the number of servings by the number of carbohydrates per serving.  The resulting number is the total amount of carbohydrates that you will be having. Learning standard serving sizes of other foods  When you eat foods containing carbohydrates that are not packaged or do not include "Nutrition Facts" on the label, you need to measure the servings in order to count the amount of carbohydrates:  Measure the foods that you will eat with a food scale or measuring  cup, if needed.  Decide how many standard-size servings you will eat.  Multiply the number of servings by 15. Most carbohydrate-rich foods have about 15 g of carbohydrates per serving.  For example, if you eat 8 oz (170 g) of strawberries, you will have eaten 2 servings and 30 g of carbohydrates (2 servings x 15 g = 30 g).  For foods that have more than one food mixed, such as soups and casseroles, you must count the carbohydrates in each food that is included. The following list contains standard serving sizes of common carbohydrate-rich foods. Each of these servings has about 15 g of carbohydrates:   hamburger bun or  English muffin.   oz (15 mL) syrup.   oz (14 g) jelly.  1 slice of bread.  1 six-inch tortilla.  3 oz (85 g) cooked rice or pasta.  4 oz (113 g) cooked dried beans.  4 oz (113 g) starchy vegetable, such as peas, corn, or potatoes.  4 oz (113 g) hot cereal.  4 oz (113 g) mashed potatoes or  of a large baked potato.  4 oz (113 g) canned or frozen fruit.  4 oz (120 mL) fruit juice.  4-6 crackers.  6 chicken nuggets.  6 oz (170 g) unsweetened dry cereal.  6 oz (170 g) plain fat-free yogurt or yogurt sweetened with artificial sweeteners.  8 oz (240 mL) milk.  8 oz (170 g) fresh fruit or one small piece of fruit.  24 oz (680 g) popped popcorn. Example of carbohydrate counting Sample meal  3 oz (85 g) chicken breast.  6 oz (  170 g) brown rice.  4 oz (113 g) corn.  8 oz (240 mL) milk.  8 oz (170 g) strawberries with sugar-free whipped topping. Carbohydrate calculation 1. Identify the foods that contain carbohydrates:  Rice.  Corn.  Milk.  Strawberries. 2. Calculate how many servings you have of each food:  2 servings rice.  1 serving corn.  1 serving milk.  1 serving strawberries. 3. Multiply each number of servings by 15 g:  2 servings rice x 15 g = 30 g.  1 serving corn x 15 g = 15 g.  1 serving milk x 15 g = 15  g.  1 serving strawberries x 15 g = 15 g. 4. Add together all of the amounts to find the total grams of carbohydrates eaten:  30 g + 15 g + 15 g + 15 g = 75 g of carbohydrates total. This information is not intended to replace advice given to you by your health care provider. Make sure you discuss any questions you have with your health care provider. Document Released: 09/26/2005 Document Revised: 04/15/2016 Document Reviewed: 03/09/2016 Elsevier Interactive Patient Education  2017 [employer]  Diabetes Mellitus and Food It is important for you to manage your blood sugar (glucose) level. Your blood glucose level can be greatly affected by what you eat. Eating healthier foods in the appropriate amounts throughout the day at about the same time each day will help you control your blood glucose level. It can also help slow or prevent worsening of your diabetes mellitus. Healthy eating may even help you improve the level of your blood pressure and reach or maintain a healthy weight. General recommendations for healthful eating and cooking habits include:  Eating meals and snacks regularly. Avoid going long periods of time without eating to lose weight.  Eating a diet that consists mainly of plant-based foods, such as fruits, vegetables, nuts, legumes, and whole grains.  Using low-heat cooking methods, such as baking, instead of high-heat cooking methods, such as deep frying. Work with your dietitian to make sure you understand how to use the Nutrition Facts information on food labels. How can food affect me? Carbohydrates  Carbohydrates affect your blood glucose level more than any other type of food. Your dietitian will help you determine how many carbohydrates to eat at each meal and teach you how to count carbohydrates. Counting carbohydrates is important to keep your blood glucose at a healthy level, especially if you are using insulin or taking certain medicines for diabetes  mellitus. Alcohol  Alcohol can cause sudden decreases in blood glucose (hypoglycemia), especially if you use insulin or take certain medicines for diabetes mellitus. Hypoglycemia can be a life-threatening condition. Symptoms of hypoglycemia (sleepiness, dizziness, and disorientation) are similar to symptoms of having too much alcohol. If your health care provider has given you approval to drink alcohol, do so in moderation and use the following guidelines:  Women should not have more than one drink per day, and men should not have more than two drinks per day. One drink is equal to:  12 oz of beer.  5 oz of wine.  1 oz of hard liquor.  Do not drink on an empty stomach.  Keep yourself hydrated. Have water, diet soda, or unsweetened iced tea.  Regular soda, juice, and other mixers might contain a lot of carbohydrates and should be counted. What foods are not recommended? As you make food choices, it is important to remember that all foods are not the  same. Some foods have fewer nutrients per serving than other foods, even though they might have the same number of calories or carbohydrates. It is difficult to get your body what it needs when you eat foods with fewer nutrients. Examples of foods that you should avoid that are high in calories and carbohydrates but low in nutrients include:  Trans fats (most processed foods list trans fats on the Nutrition Facts label).  Regular soda.  Juice.  Candy.  Sweets, such as cake, pie, doughnuts, and cookies.  Fried foods. What foods can I eat? Eat nutrient-rich foods, which will nourish your body and keep you healthy. The food you should eat also will depend on several factors, including:  The calories you need.  The medicines you take.  Your weight.  Your blood glucose level.  Your blood pressure level.  Your cholesterol level. You should eat a variety of foods, including:  Protein.  Lean cuts of meat.  Proteins low in  saturated fats, such as fish, egg whites, and beans. Avoid processed meats.  Fruits and vegetables.  Fruits and vegetables that may help control blood glucose levels, such as apples, mangoes, and yams.  Dairy products.  Choose fat-free or low-fat dairy products, such as milk, yogurt, and cheese.  Grains, bread, pasta, and rice.  Choose whole grain products, such as multigrain bread, whole oats, and brown rice. These foods may help control blood pressure.  Fats.  Foods containing healthful fats, such as nuts, avocado, olive oil, canola oil, and fish. Does everyone with diabetes mellitus have the same meal plan? Because every person with diabetes mellitus is different, there is not one meal plan that works for everyone. It is very important that you meet with a dietitian who will help you create a meal plan that is just right for you. This information is not intended to replace advice given to you by your health care provider. Make sure you discuss any questions you have with your health care provider. Document Released: 06/23/2005 Document Revised: 03/03/2016 Document Reviewed: 08/23/2013 Elsevier Interactive Patient Education  2017 [employer]

## 2016-11-15 NOTE — Progress Notes (Signed)
HPI:                                                                Mary Clarke is a 46 y.o. female who presents to [name]: Primary Care Sports Medicine today to establish care  Current Concerns include ringing in the left ear  HTN: not currently on antihypertensive therapy. Has been on Lisinopril in the past and tolerated it well. Does not check BP's at home. Denies vision change, chest pain, dyspnea, lightheadedness, and edema.    OSA: patient not using her CPAP because she cannot tolerate the face mask. She was being seen by [facility], but she does not want to go back to them for her care  Depression/Anxiety: patient states she is bereaved of her brothers last July very suddenly within days of eachother. She is still coping with this loss. She endorses depressed mood, anhedonia, insomnia, and fatigue. She also endorses excessive worry, trouble relaxing, and irritability. She has been prescribed Buspar in the past. Denies SI/HI. Denies AH/VH. She has never been hospitalized for depression or mental health.   Tinnitus: patient endorses a constant hum in her left ear x 6 weeks. She was seen in urgent care and treated with antibiotics and steroids. She states this did not improve her symptoms. She denies ear pain, hearing loss, or dizziness.  Health Maintenance Health Maintenance  Topic Date Due  . PNEUMOCOCCAL POLYSACCHARIDE VACCINE (1) 05/28/1973  . FOOT EXAM  05/28/1981  . OPHTHALMOLOGY EXAM  05/28/1981  . HIV Screening  05/28/1986  . TETANUS/TDAP  05/28/1990  . INFLUENZA VACCINE  08/15/2017 (Originally 05/10/2016)  . HEMOGLOBIN A1C  05/15/2017  . MAMMOGRAM  01/08/2018  . PAP SMEAR  12/09/2018    GYN/Sexual Health  Menstrual status: irregular periods, PCOS  LMP: unknown  Last pap smear: 2017, normal  History of abnormal pap smears: no  Sexually active: not currently  Current contraception: none  Health Habits  Diet: fair, 4-5  cups of coffee per day  Exercise: no, "too tired"   Past Medical History:  Diagnosis Date  . Diabetes mellitus without complication (HCC)   . PCOS (polycystic ovarian syndrome)   . Sleep apnea    Past Surgical History:  Procedure Laterality Date  . CHOLECYSTECTOMY     Social History  Substance Use Topics  . Smoking status: Former Smoker    Quit date: 10/13/2013  . Smokeless tobacco: Never Used  . Alcohol use No   family history includes Diabetes in her brother; Heart attack in her father and mother; Heart failure in her brother.  ROS: negative except as noted in the HPI  Medications: Current Outpatient Prescriptions  Medication Sig Dispense Refill  . atorvastatin (LIPITOR) 40 MG tablet Take 1 tablet (40 mg total) by mouth daily. 90 tablet 3  . lisinopril (PRINIVIL,ZESTRIL) 10 MG tablet Take 1 tablet (10 mg total) by mouth daily. 90 tablet 0  . metFORMIN (GLUCOPHAGE) 500 MG tablet Take 1 tablet (500 mg total) by mouth 2 (two) times daily with a meal. 180 tablet 0   No current facility-administered medications for this visit.    No Known Allergies     Objective:  BP (!) 150/93   Pulse 82   Ht 5'  2" (1.575 m)   Wt 255 lb (115.7 kg)   BMI 46.64 kg/m  Gen: well-groomed, cooperative, appears older than stated age, not ill-appearing, no distress HEENT: normal conjunctiva, removed small amount of wax from bilateral canals, TM's clear, oropharynx clear, moist mucus membranes, no thyromegaly or tenderness Pulm: Normal work of breathing, clear to auscultation bilaterally CV: Normal rate, regular rhythm, s1 and s2 distinct, no murmurs, clicks or rubs appreciated on this exam, no carotid bruit, distal pulses intact, no peripheral edema GI: soft, obese, nondistended, nontender, no masses Neuro: alert and oriented x 3, EOM's intact, PERRLA, DTR's intact MSK: strength 5/5 and symmetric in bilateral upper and lower extremities, normal gait and station, patient favors right knee, no  visible deformity or effusion Skin: warm and dry, facial hirsutism, no rashes or lesions on exposed skin Psych: normal affect, pleasant mood, normal speech and thought content  Depression screen PHQ 2/9 11/17/2016  Decreased Interest 1  Down, Depressed, Hopeless 2  PHQ - 2 Score 3  Altered sleeping 3  Tired, decreased energy 3  Change in appetite 0  Feeling bad or failure about yourself  0  Trouble concentrating 1  Moving slowly or fidgety/restless 0  Suicidal thoughts 0  PHQ-9 Score 10    GAD 7 : Generalized Anxiety Score 11/17/2016  Nervous, Anxious, on Edge 1  Control/stop worrying 2  Worry too much - different things 2  Trouble relaxing 3  Restless 0  Easily annoyed or irritable 3  Afraid - awful might happen 0  Total GAD 7 Score 11      Results for orders placed or performed in visit on 11/15/16 (from the past 72 hour(s))  POCT HgB A1C     Status: Abnormal   Collection Time: 11/15/16  3:44 PM  Result Value Ref Range   Hemoglobin A1C 8.0   Lipid panel     Status: Abnormal   Collection Time: 11/15/16  4:14 PM  Result Value Ref Range   Cholesterol 169 <200 mg/dL   Triglycerides 045 (H) <150 mg/dL   HDL 20 (L) >40 mg/dL   Total CHOL/HDL Ratio 8.5 (H) <5.0 Ratio   VLDL NOT CALC <30 mg/dL    Comment:   Not calculated due to Triglyceride >400. Suggest ordering Direct LDL (Unit Code: 98119).    LDL Cholesterol NOT CALC <100 mg/dL    Comment:   Not calculated due to Triglyceride >400. Suggest ordering Direct LDL (Unit Code: 14782).   CBC     Status: Abnormal   Collection Time: 11/15/16  4:14 PM  Result Value Ref Range   WBC 8.7 3.8 - 10.8 K/uL   RBC 5.13 (H) 3.80 - 5.10 MIL/uL   Hemoglobin 15.7 (H) 11.7 - 15.5 g/dL   HCT 95.6 (H) 21.3 - 08.6 %   MCV 91.2 80.0 - 100.0 fL   MCH 30.6 27.0 - 33.0 pg   MCHC 33.5 32.0 - 36.0 g/dL   RDW 57.8 46.9 - 62.9 %   Platelets 228 140 - 400 K/uL   MPV 11.3 7.5 - 12.5 fL  Comprehensive metabolic panel     Status: Abnormal    Collection Time: 11/15/16  4:14 PM  Result Value Ref Range   Sodium 138 135 - 146 mmol/L   Potassium 3.8 3.5 - 5.3 mmol/L   Chloride 99 98 - 110 mmol/L   CO2 28 20 - 31 mmol/L   Glucose, Bld 125 (H) 65 - 99 mg/dL   BUN 15 7 - 25  mg/dL   Creat 9.60 4.54 - 0.98 mg/dL   Total Bilirubin 0.5 0.2 - 1.2 mg/dL   Alkaline Phosphatase 71 33 - 115 U/L   AST 16 10 - 35 U/L   ALT 18 6 - 29 U/L   Total Protein 6.9 6.1 - 8.1 g/dL   Albumin 3.8 3.6 - 5.1 g/dL   Calcium 8.9 8.6 - 11.9 mg/dL  Urine Microalbumin w/creat. ratio     Status: None   Collection Time: 11/15/16  4:14 PM  Result Value Ref Range   Creatinine, Urine 167 20 - 320 mg/dL   Microalb, Ur 0.4 Not estab mg/dL   Microalb Creat Ratio 2 <30 mcg/mg creat    Comment: The ADA has defined abnormalities in albumin excretion as follows:           Category           Result                            (mcg/mg creatinine)                 Normal:    <30       Microalbuminuria:    30 - 299   Clinical albuminuria:    > or = 300   The ADA recommends that at least two of three specimens collected within a 3 - 6 month period be abnormal before considering a patient to be within a diagnostic category.      No results found.    Assessment and Plan: 46 y.o. 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  Dispense: 90 tablet; Refill: 0  OSA (obstructive sleep apnea) - discussed risks of non-adherance to CPAP. Patient expressed understanding - avoid all CNS depressants, including alcohol - Ambulatory referral to Sleep Studies for CPAP setting adjustments  Type 2 diabetes mellitus without complication, without long-term current use of insulin (HCC) Lab Results  Component Value Date   HGBA1C 8.0  11/15/2016  - starting Metformin at 500mg  BID and will titrate to 1000 BID with goal A1c <7.0 - plan to add DPP4 at follow-up visit. Patient could also benefit from GLP-1 if she is open to self-injecting - checking urine microalbumin and initiating ACE inhibitor - referring to nutritionist to help make necessary diet changes for comorbid obesity and hypertriglyceridemia - metFORMIN (GLUCOPHAGE) 500 MG tablet; Take 1 tablet (500 mg total) by mouth 2 (two) times daily with a meal.  Dispense: 180 tablet; Refill: 0 - lisinopril (PRINIVIL,ZESTRIL) 10 MG tablet; Take 1 tablet (10 mg total) by mouth daily.  Dispense: 90 tablet; Refill: 0 - atorvastatin (LIPITOR) 40 MG tablet; Take 1 tablet (40 mg total) by mouth daily.  Dispense: 90 tablet; Refill: 3 - Amb ref to Medical Nutrition Therapy-MNT  Hypertriglyceridemia - due to comorbidity, patient is candidate for high-intensity statin therapy - Lipid panel - atorvastatin (LIPITOR) 40 MG tablet; Take 1 tablet (40 mg total) by mouth daily.  Dispense: 90 tablet; Refill: 3 - Amb ref to Medical Nutrition Therapy-MNT  Left-sided tinnitus - Ambulatory referral to ENT  Moderate episode of recurrent MDD, Anxiety - PHQ9 score of 10 and GAD7 score of 11 today - offered to start SSRI. Patient opted  to defer until one month follow-up since she is starting HTN and DM meds - patient is not a threat to herself or others - close follow-up in 1 month  Patient education and anticipatory guidance given Patient agrees with treatment plan Follow-up in 1 month or sooner as needed  Levonne Hubertharley E. Kylia Grajales PA-C

## 2016-11-16 LAB — COMPREHENSIVE METABOLIC PANEL
ALBUMIN: 3.8 g/dL (ref 3.6–5.1)
ALT: 18 U/L (ref 6–29)
AST: 16 U/L (ref 10–35)
Alkaline Phosphatase: 71 U/L (ref 33–115)
BUN: 15 mg/dL (ref 7–25)
CALCIUM: 8.9 mg/dL (ref 8.6–10.2)
CHLORIDE: 99 mmol/L (ref 98–110)
CO2: 28 mmol/L (ref 20–31)
Creat: 0.62 mg/dL (ref 0.50–1.10)
Glucose, Bld: 125 mg/dL — ABNORMAL HIGH (ref 65–99)
POTASSIUM: 3.8 mmol/L (ref 3.5–5.3)
Sodium: 138 mmol/L (ref 135–146)
TOTAL PROTEIN: 6.9 g/dL (ref 6.1–8.1)
Total Bilirubin: 0.5 mg/dL (ref 0.2–1.2)

## 2016-11-16 LAB — LIPID PANEL
CHOLESTEROL: 169 mg/dL (ref <200)
HDL: 20 mg/dL — AB (ref >50)
TRIGLYCERIDES: 690 mg/dL — AB (ref <150)
Total CHOL/HDL Ratio: 8.5 Ratio — ABNORMAL HIGH (ref <5.0)

## 2016-11-16 LAB — MICROALBUMIN / CREATININE URINE RATIO
CREATININE, URINE: 167 mg/dL (ref 20–320)
MICROALB UR: 0.4 mg/dL
MICROALB/CREAT RATIO: 2 ug/mg{creat} (ref <30)

## 2016-11-17 ENCOUNTER — Encounter: Payer: Self-pay | Admitting: Physician Assistant

## 2016-11-17 ENCOUNTER — Telehealth: Payer: Self-pay | Admitting: Physician Assistant

## 2016-11-17 DIAGNOSIS — H9313 Tinnitus, bilateral: Secondary | ICD-10-CM | POA: Insufficient documentation

## 2016-11-17 DIAGNOSIS — F331 Major depressive disorder, recurrent, moderate: Secondary | ICD-10-CM | POA: Insufficient documentation

## 2016-11-17 DIAGNOSIS — E119 Type 2 diabetes mellitus without complications: Secondary | ICD-10-CM | POA: Insufficient documentation

## 2016-11-17 MED ORDER — ATORVASTATIN CALCIUM 40 MG PO TABS: 40.0000 mg | ORAL_TABLET | Freq: Every day | ORAL | 3 refills | Status: DC

## 2016-11-22 ENCOUNTER — Ambulatory Visit (INDEPENDENT_AMBULATORY_CARE_PROVIDER_SITE_OTHER): Payer: BLUE CROSS/BLUE SHIELD | Admitting: Physician Assistant

## 2016-11-22 VITALS — BP 117/77 | HR 86 | Temp 98.7°F | Wt 251.0 lb

## 2016-11-22 DIAGNOSIS — I152 Hypertension secondary to endocrine disorders: Secondary | ICD-10-CM

## 2016-11-22 DIAGNOSIS — E349 Endocrine disorder, unspecified: Secondary | ICD-10-CM

## 2016-11-22 DIAGNOSIS — J111 Influenza due to unidentified influenza virus with other respiratory manifestations: Secondary | ICD-10-CM | POA: Diagnosis not present

## 2016-11-22 LAB — POCT INFLUENZA A/B
INFLUENZA A, POC: NEGATIVE
INFLUENZA B, POC: NEGATIVE

## 2016-11-22 MED ORDER — OSELTAMIVIR PHOSPHATE 75 MG PO CAPS: 75.0000 mg | ORAL_CAPSULE | Freq: Two times a day (BID) | ORAL | 0 refills | Status: DC

## 2016-11-22 NOTE — Progress Notes (Signed)
HPI:                                                                Mary Clarke is a 46 y.o. female who presents to [name]: Primary Care Sports Medicine today for flu-like symptoms  Patient reports sudden onset chills, myalgias, sore throat, and cough starting last night. She endorses one episode of vomiting last night. Denies abdominal pain. Denies wheezing, dyspnea, hemoptysis. Former smoker, quit approx. 2 years ago. Denies hx of asthma/ COPD. She did not receive the influenza vaccine this season.    Past Medical History:  Diagnosis Date  . Anxiety   . Depression   . Diabetes mellitus without complication (HCC)   . PCOS (polycystic ovarian syndrome)   . Sleep apnea    Past Surgical History:  Procedure Laterality Date  . CHOLECYSTECTOMY     Social History  Substance Use Topics  . Smoking status: Former Smoker    Quit date: 06/11/2015  . Smokeless tobacco: Never Used  . Alcohol use No   family history includes Aneurysm in her brother; Diabetes in her brother and brother; Heart attack in her father and mother; Hypertension in her brother and mother; Lung cancer in her brother; Stroke in her brother.  ROS: negative except as noted in the HPI  Medications: Current Outpatient Prescriptions  Medication Sig Dispense Refill  . atorvastatin (LIPITOR) 40 MG tablet Take 1 tablet (40 mg total) by mouth daily. 90 tablet 3  . lisinopril (PRINIVIL,ZESTRIL) 10 MG tablet Take 1 tablet (10 mg total) by mouth daily. 90 tablet 0  . metFORMIN (GLUCOPHAGE) 500 MG tablet Take 1 tablet (500 mg total) by mouth 2 (two) times daily with a meal. 180 tablet 0   No current facility-administered medications for this visit.    No Known Allergies     Objective:  BP 117/77   Pulse 86   Temp 98.7 F (37.1 C) (Oral)   Wt 251 lb (113.9 kg)   BMI 45.91 kg/m  Gen: well-groomed, cooperative, not ill-appearing, no distress HEENT: normal conjunctiva, nasal mucosa normal,  oropharynx clear, no exudates, uvula midline, moist mucus membranes, no frontal or maxillary sinus tenderness Pulm: Normal work of breathing, normal phonation, clear to auscultation bilaterally, no wheezes, rales or rhonchi CV: Normal rate, regular rhythm, s1 and s2 distinct, no murmurs, clicks or rubs  GI: soft, obese, nondistended, nontender Neuro: alert and oriented x 3, EOM's intact Lymph: no cervical or tonsillar adenopathy Skin: warm and dry, no rashes or lesions on exposed skin, no cyanosis   Results for orders placed or performed in visit on 11/22/16 (from the past 72 hour(s))  POCT Influenza A/B     Status: Normal   Collection Time: 11/22/16  2:39 PM  Result Value Ref Range   Influenza A, POC Negative Negative   Influenza B, POC Negative Negative   No results found.    Assessment and Plan: 46 y.o. female with   1. Influenza - POCT Influenza A/B negative, but patient has hallmark symptoms. She falls within the window for Tamiflu - symptomatic management with Ibuprofen and Theraflu - push PO fluids - work note provided. Instructed not to return until symptom-free for at least 24 hours - oseltamivir (TAMIFLU) 75 MG capsule; Take  1 capsule (75 mg total) by mouth 2 (two) times daily.  Dispense: 10 capsule; Refill: 0  2. Hypertension - started patient on Lisinopril 10mg  last week. BP is in range today. Tolerating medication without adverse effects.  Patient education and anticipatory guidance given Patient agrees with treatment plan Follow-up in 4 weeks for diabetes follow-up or sooner as needed  Levonne Hubertharley E. Takisha Pelle PA-C

## 2016-11-22 NOTE — Patient Instructions (Addendum)
Drink plenty of water and clear liquids Ibuprofen 800mg  every 8 hours for sore throat and body aches OTC Theraflu Take Tamiflu as prescribed No work until symptom-free for 24 hours  Influenza, Adult Influenza, more commonly known as "the flu," is a viral infection that primarily affects the respiratory tract. The respiratory tract includes organs that help you breathe, such as the lungs, nose, and throat. The flu causes many common cold symptoms, as well as a high fever and body aches. The flu spreads easily from person to person (is contagious). Getting a flu shot (influenza vaccination) every year is the best way to prevent influenza. What are the causes? Influenza is caused by a virus. You can catch the virus by:  Breathing in droplets from an infected person's cough or sneeze.  Touching something that was recently contaminated with the virus and then touching your mouth, nose, or eyes. What increases the risk? The following factors may make you more likely to get the flu:  Not cleaning your hands frequently with soap and water or alcohol-based hand sanitizer.  Having close contact with many people during cold and flu season.  Touching your mouth, eyes, or nose without washing or sanitizing your hands first.  Not drinking enough fluids or not eating a healthy diet.  Not getting enough sleep or exercise.  Being under a high amount of stress.  Not getting a yearly (annual) flu shot. You may be at a higher risk of complications from the flu, such as a severe lung infection (pneumonia), if you:  Are over the age of 46.  Are pregnant.  Have a weakened disease-fighting system (immune system). You may have a weakened immune system if you:  Have HIV or AIDS.  Are undergoing chemotherapy.  Aretaking medicines that reduce the activity of (suppress) the immune system.  Have a long-term (chronic) illness, such as heart disease, kidney disease, diabetes, or lung disease.  Have a  liver disorder.  Are obese.  Have anemia. What are the signs or symptoms? Symptoms of this condition typically last 4-10 days and may include:  Fever.  Chills.  Headache, body aches, or muscle aches.  Sore throat.  Cough.  Runny or congested nose.  Chest discomfort and cough.  Poor appetite.  Weakness or tiredness (fatigue).  Dizziness.  Nausea or vomiting. How is this diagnosed? This condition may be diagnosed based on your medical history and a physical exam. Your health care provider may do a nose or throat swab test to confirm the diagnosis. How is this treated? If influenza is detected early, you can be treated with antiviral medicine that can reduce the length of your illness and the severity of your symptoms. This medicine may be given by mouth (orally) or through an IV tube that is inserted in one of your veins. The goal of treatment is to relieve symptoms by taking care of yourself at home. This may include taking over-the-counter medicines, drinking plenty of fluids, and adding humidity to the air in your home. In some cases, influenza goes away on its own. Severe influenza or complications from influenza may be treated in a hospital. Follow these instructions at home:  Take over-the-counter and prescription medicines only as told by your health care provider.  Use a cool mist humidifier to add humidity to the air in your home. This can make breathing easier.  Rest as needed.  Drink enough fluid to keep your urine clear or pale yellow.  Cover your mouth and nose when you  cough or sneeze.  Wash your hands with soap and water often, especially after you cough or sneeze. If soap and water are not available, use hand sanitizer.  Stay home from work or school as told by your health care provider. Unless you are visiting your health care provider, try to avoid leaving home until your fever has been gone for 24 hours without the use of medicine.  Keep all  follow-up visits as told by your health care provider. This is important. How is this prevented?  Getting an annual flu shot is the best way to avoid getting the flu. You may get the flu shot in late summer, fall, or winter. Ask your health care provider when you should get your flu shot.  Wash your hands often or use hand sanitizer often.  Avoid contact with people who are sick during cold and flu season.  Eat a healthy diet, drink plenty of fluids, get enough sleep, and exercise regularly. Contact a health care provider if:  You develop new symptoms.  You have:  Chest pain.  Diarrhea.  A fever.  Your cough gets worse.  You produce more mucus.  You feel nauseous or you vomit. Get help right away if:  You develop shortness of breath or difficulty breathing.  Your skin or nails turn a bluish color.  You have severe pain or stiffness in your neck.  You develop a sudden headache or sudden pain in your face or ear.  You cannot stop vomiting. This information is not intended to replace advice given to you by your health care provider. Make sure you discuss any questions you have with your health care provider. Document Released: 09/23/2000 Document Revised: 03/03/2016 Document Reviewed: 07/21/2015 Elsevier Interactive Patient Education  2017 [employer].

## 2016-11-23 ENCOUNTER — Encounter: Payer: Self-pay | Admitting: Physician Assistant

## 2016-12-06 DIAGNOSIS — H9313 Tinnitus, bilateral: Secondary | ICD-10-CM | POA: Diagnosis not present

## 2016-12-06 DIAGNOSIS — H903 Sensorineural hearing loss, bilateral: Secondary | ICD-10-CM | POA: Diagnosis not present

## 2016-12-07 NOTE — Telephone Encounter (Signed)
Opened in error

## 2016-12-08 ENCOUNTER — Encounter: Payer: Self-pay | Admitting: Physician Assistant

## 2016-12-12 ENCOUNTER — Ambulatory Visit (INDEPENDENT_AMBULATORY_CARE_PROVIDER_SITE_OTHER): Payer: BLUE CROSS/BLUE SHIELD | Admitting: Physician Assistant

## 2016-12-12 VITALS — BP 128/79 | HR 84 | Wt 249.0 lb

## 2016-12-12 DIAGNOSIS — F331 Major depressive disorder, recurrent, moderate: Secondary | ICD-10-CM | POA: Diagnosis not present

## 2016-12-12 DIAGNOSIS — G4733 Obstructive sleep apnea (adult) (pediatric): Secondary | ICD-10-CM | POA: Diagnosis not present

## 2016-12-12 DIAGNOSIS — Z23 Encounter for immunization: Secondary | ICD-10-CM

## 2016-12-12 DIAGNOSIS — E119 Type 2 diabetes mellitus without complications: Secondary | ICD-10-CM | POA: Diagnosis not present

## 2016-12-12 DIAGNOSIS — I351 Nonrheumatic aortic (valve) insufficiency: Secondary | ICD-10-CM | POA: Diagnosis not present

## 2016-12-12 DIAGNOSIS — E1169 Type 2 diabetes mellitus with other specified complication: Secondary | ICD-10-CM

## 2016-12-12 DIAGNOSIS — E785 Hyperlipidemia, unspecified: Secondary | ICD-10-CM | POA: Diagnosis not present

## 2016-12-12 MED ORDER — NONFORMULARY OR COMPOUNDED ITEM: 0 refills | Status: DC

## 2016-12-12 NOTE — Patient Instructions (Addendum)
Xyzal and Flonase daily for allergies  Increase your Metformin to 2 tabs (1000mg ) with meals twice daily  Have your eye doctor send your exam results here  You will be contacted about scheduling ultrasound of your heart (ECHO)  Follow-up in 3 months

## 2016-12-12 NOTE — Progress Notes (Signed)
HPI:                                                                Mary Clarke is a 46 y.o. female who presents to [facility]: [facility] Medicine today for diabetes follow-up  DMII: taking Metformin 500mg  bid. Checks blood sugars at home. Fasting morning sugar 90-110. Denies polyuria, vision problems, lightheadness, and paresthesias. Denies hypoglycemic events.  Eye exam: scheduled for 01/02/17 Foot exam: due Diet: scheduled to see the nutritionist this week. States she is drinking a low-sugar protein drink every morning.  Exercise: none  HLD: tolerating Atorvastatin without difficulty. Denies myalgias or abdominal pain. Lab Results  Component Value Date   CHOL 169 11/15/2016   HDL 20 (L) 11/15/2016   LDLCALC NOT CALC 11/15/2016   TRIG 690 (H) 11/15/2016   CHOLHDL 8.5 (H) 11/15/2016    Past Medical History:  Diagnosis Date  . Anxiety   . Depression   . Diabetes mellitus without complication (HCC)   . PCOS (polycystic ovarian syndrome)   . Sleep apnea    Past Surgical History:  Procedure Laterality Date  . CHOLECYSTECTOMY     Social History  Substance Use Topics  . Smoking status: Former Smoker    Quit date: 06/11/2015  . Smokeless tobacco: Never Used  . Alcohol use No   family history includes Aneurysm in her brother; Diabetes in her brother and brother; Heart attack in her father and mother; Hypertension in her brother and mother; Lung cancer in her brother; Stroke in her brother.  ROS: negative except as noted in the HPI  Medications: Current Outpatient Prescriptions  Medication Sig Dispense Refill  . atorvastatin (LIPITOR) 40 MG tablet Take 1 tablet (40 mg total) by mouth daily. 90 tablet 3  . lisinopril (PRINIVIL,ZESTRIL) 10 MG tablet Take 1 tablet (10 mg total) by mouth daily. 90 tablet 0  . metFORMIN (GLUCOPHAGE) 500 MG tablet Take 1 tablet (500 mg total) by mouth 2 (two) times daily with a meal. 180 tablet 0   No current  facility-administered medications for this visit.    No Known Allergies   Objective:  BP 128/79   Pulse 84   Wt 249 lb (112.9 kg)   BMI 45.54 kg/m  Gen: well-groomed, obese, cooperative, not ill-appearing, no distress Pulm: Normal work of breathing, normal phonation, clear to auscultation bilaterally, no wheezes, rales or rhonchi CV: Normal rate, regular rhythm, s1 and s2 distinct, grade II/VI aortic ejection murmur that radiates across the precordium, no carotid bruit Neuro: alert and oriented x 3, EOM's intact Skin: warm and dry, plantar's wart on left foot, no cyanosis Psych: flat affect, euthymic mood, organized speech and thought content  Diabetic Foot Exam - Simple   Simple Foot Form Diabetic Foot exam was performed with the following findings:  Yes 12/12/2016 12:09 PM  Visual Inspection No deformities, no ulcerations, no other skin breakdown bilaterally:  Yes Sensation Testing Intact to touch and monofilament testing bilaterally:  Yes Pulse Check Posterior Tibialis and Dorsalis pulse intact bilaterally:  Yes Comments      Depression screen PHQ 2/9 11/17/2016  Decreased Interest 1  Down, Depressed, Hopeless 2  PHQ - 2 Score 3  Altered sleeping 3  Tired, decreased energy 3  Change  in appetite 0  Feeling bad or failure about yourself  0  Trouble concentrating 1  Moving slowly or fidgety/restless 0  Suicidal thoughts 0  PHQ-9 Score 10     Assessment and Plan: 46 y.o. female with   Type 2 diabetes mellitus without complication, without long-term current use of insulin (HCC) - increase metformin to 1000mg  bid - have eye doctor send eye exam results to our practice - foot exam UTD - no diabetic neuropathy - pneumovax given today - cont ACE and Atorvastatin - follow-up in 3 months  Aortic ejection murmur, OSA (obstructive sleep apnea) - given history of HTN and OSA, want to ensure normal EF in addition to r/o aortic stenosis - ECHOCARDIOGRAM COMPLETE;  Future  Need for Tdap vaccination - Tdap vaccine greater than or equal to 7yo IM  Moderate recurrent MDD - patient declines SSRI therapy. She is not a safety risk. - will continue to follow closely  Patient education and anticipatory guidance given Patient agrees with treatment plan Follow-up in 3 months or sooner as needed  Levonne Hubert PA-C

## 2016-12-14 DIAGNOSIS — E282 Polycystic ovarian syndrome: Secondary | ICD-10-CM | POA: Diagnosis not present

## 2016-12-16 DIAGNOSIS — E785 Hyperlipidemia, unspecified: Secondary | ICD-10-CM

## 2016-12-16 DIAGNOSIS — E1169 Type 2 diabetes mellitus with other specified complication: Secondary | ICD-10-CM | POA: Insufficient documentation

## 2017-01-02 ENCOUNTER — Ambulatory Visit (INDEPENDENT_AMBULATORY_CARE_PROVIDER_SITE_OTHER): Payer: BLUE CROSS/BLUE SHIELD | Admitting: Physician Assistant

## 2017-01-02 VITALS — BP 124/77 | HR 91 | Wt 243.0 lb

## 2017-01-02 DIAGNOSIS — H6980 Other specified disorders of Eustachian tube, unspecified ear: Secondary | ICD-10-CM | POA: Diagnosis not present

## 2017-01-02 DIAGNOSIS — R42 Dizziness and giddiness: Secondary | ICD-10-CM | POA: Diagnosis not present

## 2017-01-02 MED ORDER — FLUTICASONE PROPIONATE 50 MCG/ACT NA SUSP: 2.0000 | Freq: Every day | NASAL | 6 refills | Status: DC

## 2017-01-02 MED ORDER — MECLIZINE HCL 25 MG PO TABS: 25.0000 mg | ORAL_TABLET | Freq: Three times a day (TID) | ORAL | 1 refills | Status: DC | PRN

## 2017-01-02 NOTE — Progress Notes (Signed)
HPI:                                                                Mary Clarke is a 46 y.o. female who [facility]: Primary Care Sports Medicine today for dizziness and sinus congestion  Patient with PMH DM II, PCOS, tinnitus, reports sinus congestion x 2 days. Endorses sneezing, frontal headache, sinus pressure, bilateral ear fullness and rhinorrhea x 2 days. Denies fever, chills, sore throat, cough, dyspnea, wheezing.   Reports sudden onset dizziness this morning when she got out of bed. Dizziness lasts for seconds-minutes and is relieved with closing her eyes. Reports it felt like room was spinning. Reports she had difficulty walking down the stairs today. States she has a history of vertigo that self-resolved two years ago. Denies paresthesias or focal weakness.   Past Medical History:  Diagnosis Date  . Anxiety   . Depression   . Diabetes mellitus without complication (HCC)   . PCOS (polycystic ovarian syndrome)   . Sleep apnea    Past Surgical History:  Procedure Laterality Date  . CHOLECYSTECTOMY     Social History  Substance Use Topics  . Smoking status: Former Smoker    Quit date: 06/11/2015  . Smokeless tobacco: Never Used  . Alcohol use No   family history includes Aneurysm in her brother; Diabetes in her brother and brother; Heart attack in her father and mother; Hypertension in her brother and mother; Lung cancer in her brother; Stroke in her brother.  ROS: negative except as noted in the HPI  Medications: Current Outpatient Prescriptions  Medication Sig Dispense Refill  . atorvastatin (LIPITOR) 40 MG tablet Take 1 tablet (40 mg total) by mouth daily. 90 tablet 3  . Cholecalciferol (VITAMIN D3 PO) Take by mouth.    . Levocetirizine Dihydrochloride (XYZAL PO) Take by mouth.    Marland Kitchen. lisinopril (PRINIVIL,ZESTRIL) 10 MG tablet Take 1 tablet (10 mg total) by mouth daily. 90 tablet 0  . metFORMIN (GLUCOPHAGE) 500 MG tablet Take 1 tablet (500  mg total) by mouth 2 (two) times daily with a meal. 180 tablet 0  . Multiple Vitamin (MULTIVITAMIN) tablet Take 1 tablet by mouth daily.    . NONFORMULARY OR COMPOUNDED ITEM Glucometer with lancets and strips 1 each 0   No current facility-administered medications for this visit.    No Known Allergies  Objective:  BP 124/77   Pulse 91   Wt 243 lb (110.2 kg)   BMI 44.45 kg/m  Gen: well-groomed, obese, not ill-appearing, no acute distress HEENT: head normocephalic, atraumatic; normal conjunctiva, TM's clear, oropharynx clear, moist mucus membranes Pulm: Normal work of breathing, normal phonation, clear to auscultation bilaterally CV: Normal rate, regular rhythm, s1 and s2 distinct, grade II/VI systolic ejection murmur Neuro:  cranial nerves II-XII intact, no nystagmus, no papilledema, normal finger-to-nose, normal heel-to-shin, negative pronator drift, normal coordination, normal tone, no tremor, + Mary Clarke MSK: strength 5/5 and symmetric in bilateral upper and lower extremities, normal gait and station Mental Status: alert and oriented x 3, normal speech, cooperative, organized thought content   Assessment and Plan: 46 y.o. female with   1. Vertigo - vertigo reproduced with Dix Clarke maneuver, no nystagmus. Reassuring neuro exam - patient has a hx  of tinnitus and high-frequency hearing loss that is causing her significant distress. We discussed low possibility of Meniere's disease or acoustic neuroma, but most likely this is BPPV. - recommended follow-up with ENT. If symptoms persist, will refer to PT for vestibular rehab. - meclizine (ANTIVERT) 25 MG tablet; Take 1 tablet (25 mg total) by mouth 3 (three) times daily as needed for dizziness or nausea.  Dispense: 30 tablet; Refill: 1  2. Dysfunction of Eustachian tube, unspecified laterality - fluticasone (FLONASE) 50 MCG/ACT nasal spray; Place 2 sprays into both nostrils daily.  Dispense: 16 g; Refill: 6  Patient education  and anticipatory guidance given Patient agrees with treatment plan Follow-up as needed if symptoms worsen or fail to improve  Mary Hubert PA-C

## 2017-01-02 NOTE — Patient Instructions (Addendum)
Take Meclizine as needed for dizziness Epley Maneuver (see below) Follow-up with ENT   Benign Positional Vertigo Vertigo is the feeling that you or your surroundings are moving when they are not. Benign positional vertigo is the most common form of vertigo. The cause of this condition is not serious (is benign). This condition is triggered by certain movements and positions (is positional). This condition can be dangerous if it occurs while you are doing something that could endanger you or others, such as driving. What are the causes? In many cases, the cause of this condition is not known. It may be caused by a disturbance in an area of the inner ear that helps your brain to sense movement and balance. This disturbance can be caused by a viral infection (labyrinthitis), head injury, or repetitive motion. What increases the risk? This condition is more likely to develop in:  Women.  People who are 71 years of age or older. What are the signs or symptoms? Symptoms of this condition usually happen when you move your head or your eyes in different directions. Symptoms may start suddenly, and they usually last for less than a minute. Symptoms may include:  Loss of balance and falling.  Feeling like you are spinning or moving.  Feeling like your surroundings are spinning or moving.  Nausea and vomiting.  Blurred vision.  Dizziness.  Involuntary eye movement (nystagmus). Symptoms can be mild and cause only slight annoyance, or they can be severe and interfere with daily life. Episodes of benign positional vertigo may return (recur) over time, and they may be triggered by certain movements. Symptoms may improve over time. How is this diagnosed? This condition is usually diagnosed by medical history and a physical exam of the head, neck, and ears. You may be referred to a health care provider who specializes in ear, nose, and throat (ENT) problems (otolaryngologist) or a provider who  specializes in disorders of the nervous system (neurologist). You may have additional testing, including:  MRI.  A CT scan.  Eye movement tests. Your health care provider may ask you to change positions quickly while he or she watches you for symptoms of benign positional vertigo, such as nystagmus. Eye movement may be tested with an electronystagmogram (ENG), caloric stimulation, the Dix-Hallpike test, or the roll test.  An electroencephalogram (EEG). This records electrical activity in your brain.  Hearing tests. How is this treated? Usually, your health care provider will treat this by moving your head in specific positions to adjust your inner ear back to normal. Surgery may be needed in severe cases, but this is rare. In some cases, benign positional vertigo may resolve on its own in 2-4 weeks. Follow these instructions at home: Safety   Move slowly.Avoid sudden body or head movements.  Avoid driving.  Avoid operating heavy machinery.  Avoid doing any tasks that would be dangerous to you or others if a vertigo episode would occur.  If you have trouble walking or keeping your balance, try using a cane for stability. If you feel dizzy or unstable, sit down right away.  Return to your normal activities as told by your health care provider. Ask your health care provider what activities are safe for you. General instructions   Take over-the-counter and prescription medicines only as told by your health care provider.  Avoid certain positions or movements as told by your health care provider.  Drink enough fluid to keep your urine clear or pale yellow.  Keep all follow-up visits  as told by your health care provider. This is important. Contact a health care provider if:  You have a fever.  Your condition gets worse or you develop new symptoms.  Your family or friends notice any behavioral changes.  Your nausea or vomiting gets worse.  You have numbness or a "pins and  needles" sensation. Get help right away if:  You have difficulty speaking or moving.  You are always dizzy.  You faint.  You develop severe headaches.  You have weakness in your legs or arms.  You have changes in your hearing or vision.  You develop a stiff neck.  You develop sensitivity to light. This information is not intended to replace advice given to you by your health care provider. Make sure you discuss any questions you have with your health care provider. Document Released: 07/04/2006 Document Revised: 03/03/2016 Document Reviewed: 01/19/2015 Elsevier Interactive Patient Education  2017 Elsevier Inc.  How to Perform the Epley Maneuver The Epley maneuver is an exercise that relieves symptoms of vertigo. Vertigo is the feeling that you or your surroundings are moving when they are not. When you feel vertigo, you may feel like the room is spinning and have trouble walking. Dizziness is a little different than vertigo. When you are dizzy, you may feel unsteady or light-headed. You can do this maneuver at home whenever you have symptoms of vertigo. You can do it up to 3 times a day until your symptoms go away. Even though the Epley maneuver may relieve your vertigo for a few weeks, it is possible that your symptoms will return. This maneuver relieves vertigo, but it does not relieve dizziness. What are the risks? If it is done correctly, the Epley maneuver is considered safe. Sometimes it can lead to dizziness or nausea that goes away after a short time. If you develop other symptoms, such as changes in vision, weakness, or numbness, stop doing the maneuver and call your health care provider. How to perform the Epley maneuver 1. Sit on the edge of a bed or table with your back straight and your legs extended or hanging over the edge of the bed or table. 2. Turn your head halfway toward the affected ear or side. 3. Lie backward quickly with your head turned until you are lying  flat on your back. You may want to position a pillow under your shoulders. 4. Hold this position for 30 seconds. You may experience an attack of vertigo. This is normal. 5. Turn your head to the opposite direction until your unaffected ear is facing the floor. 6. Hold this position for 30 seconds. You may experience an attack of vertigo. This is normal. Hold this position until the vertigo stops. 7. Turn your whole body to the same side as your head. Hold for another 30 seconds. 8. Sit back up. You can repeat this exercise up to 3 times a day. Follow these instructions at home:  After doing the Epley maneuver, you can return to your normal activities.  Ask your health care provider if there is anything you should do at home to prevent vertigo. He or she may recommend that you:  Keep your head raised (elevated) with two or more pillows while you sleep.  Do not sleep on the side of your affected ear.  Get up slowly from bed.  Avoid sudden movements during the day.  Avoid extreme head movement, like looking up or bending over. Contact a health care provider if:  Your vertigo gets  worse.  You have other symptoms, including:  Nausea.  Vomiting.  Headache. Get help right away if:  You have vision changes.  You have a severe or worsening headache or neck pain.  You cannot stop vomiting.  You have new numbness or weakness in any part of your body. Summary  Vertigo is the feeling that you or your surroundings are moving when they are not.  The Epley maneuver is an exercise that relieves symptoms of vertigo.  If the Epley maneuver is done correctly, it is considered safe. You can do it up to 3 times a day. This information is not intended to replace advice given to you by your health care provider. Make sure you discuss any questions you have with your health care provider. Document Released: 10/01/2013 Document Revised: 08/16/2016 Document Reviewed: 08/16/2016 Elsevier  Interactive Patient Education  2017 [employer]

## 2017-01-04 ENCOUNTER — Ambulatory Visit (HOSPITAL_BASED_OUTPATIENT_CLINIC_OR_DEPARTMENT_OTHER)
Admission: RE | Admit: 2017-01-04 | Discharge: 2017-01-04 | Disposition: A | Discharge status: Home / Self Care | Payer: BLUE CROSS/BLUE SHIELD | Source: Ambulatory Visit | Attending: Physician Assistant | Admitting: Physician Assistant

## 2017-01-04 ENCOUNTER — Encounter: Payer: Self-pay | Admitting: Physician Assistant

## 2017-01-04 DIAGNOSIS — R42 Dizziness and giddiness: Secondary | ICD-10-CM | POA: Diagnosis not present

## 2017-01-04 DIAGNOSIS — E119 Type 2 diabetes mellitus without complications: Secondary | ICD-10-CM | POA: Insufficient documentation

## 2017-01-04 DIAGNOSIS — I351 Nonrheumatic aortic (valve) insufficiency: Secondary | ICD-10-CM

## 2017-01-04 DIAGNOSIS — I082 Rheumatic disorders of both aortic and tricuspid valves: Secondary | ICD-10-CM | POA: Diagnosis not present

## 2017-01-04 DIAGNOSIS — G4733 Obstructive sleep apnea (adult) (pediatric): Secondary | ICD-10-CM | POA: Diagnosis not present

## 2017-01-04 DIAGNOSIS — I340 Nonrheumatic mitral (valve) insufficiency: Secondary | ICD-10-CM

## 2017-01-04 HISTORY — DX: Nonrheumatic mitral (valve) insufficiency: I34.0

## 2017-01-04 NOTE — Progress Notes (Signed)
  Echocardiogram 2D Echocardiogram has been performed.  Mary Clarke, Mary Clarke 01/04/2017, 2:05 PM

## 2017-01-05 ENCOUNTER — Telehealth: Payer: Self-pay | Admitting: Physician Assistant

## 2017-01-05 DIAGNOSIS — I340 Nonrheumatic mitral (valve) insufficiency: Secondary | ICD-10-CM

## 2017-01-05 NOTE — Telephone Encounter (Signed)
Patient found to have MR on TTE. Unable to fully characterize MR or degree or severity and recommended follow-up TEE.

## 2017-01-10 ENCOUNTER — Other Ambulatory Visit: Payer: Self-pay | Admitting: Sports Medicine

## 2017-01-12 ENCOUNTER — Other Ambulatory Visit: Payer: Self-pay | Admitting: Physician Assistant

## 2017-01-12 DIAGNOSIS — E282 Polycystic ovarian syndrome: Secondary | ICD-10-CM

## 2017-01-12 DIAGNOSIS — E119 Type 2 diabetes mellitus without complications: Secondary | ICD-10-CM

## 2017-01-18 ENCOUNTER — Other Ambulatory Visit: Payer: Self-pay | Admitting: Physician Assistant

## 2017-01-18 ENCOUNTER — Telehealth: Payer: Self-pay

## 2017-01-18 DIAGNOSIS — E119 Type 2 diabetes mellitus without complications: Secondary | ICD-10-CM

## 2017-01-18 DIAGNOSIS — E781 Pure hyperglyceridemia: Secondary | ICD-10-CM

## 2017-01-18 NOTE — Telephone Encounter (Signed)
Lekeya called and stated the refill on the atorvastatin she received in February only had 60 tablets instead of 90. She will need 30 tablets to get her to her next refill. Insurance will not cover the 30 tablets due to to early for refill. I called in atorvastatin 40 mg # 30 per Gena Fray, PA-C to [employer]. Faxed GoodRx coupon to pharmacy. Left message advising patient.

## 2017-01-19 ENCOUNTER — Encounter: Payer: Self-pay | Admitting: Physician Assistant

## 2017-01-19 ENCOUNTER — Ambulatory Visit (INDEPENDENT_AMBULATORY_CARE_PROVIDER_SITE_OTHER): Payer: BLUE CROSS/BLUE SHIELD | Admitting: Physician Assistant

## 2017-01-19 ENCOUNTER — Ambulatory Visit (INDEPENDENT_AMBULATORY_CARE_PROVIDER_SITE_OTHER): Payer: BLUE CROSS/BLUE SHIELD

## 2017-01-19 ENCOUNTER — Other Ambulatory Visit: Payer: Self-pay | Admitting: Physician Assistant

## 2017-01-19 VITALS — BP 115/76 | HR 76 | Temp 98.1°F | Wt 244.0 lb

## 2017-01-19 DIAGNOSIS — Z87891 Personal history of nicotine dependence: Secondary | ICD-10-CM

## 2017-01-19 DIAGNOSIS — I10 Essential (primary) hypertension: Secondary | ICD-10-CM | POA: Diagnosis not present

## 2017-01-19 DIAGNOSIS — I340 Nonrheumatic mitral (valve) insufficiency: Secondary | ICD-10-CM

## 2017-01-19 DIAGNOSIS — E119 Type 2 diabetes mellitus without complications: Secondary | ICD-10-CM

## 2017-01-19 DIAGNOSIS — E282 Polycystic ovarian syndrome: Secondary | ICD-10-CM

## 2017-01-19 DIAGNOSIS — R05 Cough: Secondary | ICD-10-CM

## 2017-01-19 DIAGNOSIS — Z7722 Contact with and (suspected) exposure to environmental tobacco smoke (acute) (chronic): Secondary | ICD-10-CM

## 2017-01-19 DIAGNOSIS — R059 Cough, unspecified: Secondary | ICD-10-CM

## 2017-01-19 MED ORDER — AMLODIPINE BESYLATE 5 MG PO TABS: 5.0000 mg | ORAL_TABLET | Freq: Every day | ORAL | 3 refills | Status: DC

## 2017-01-19 MED ORDER — BENZONATATE 200 MG PO CAPS: 200.0000 mg | ORAL_CAPSULE | Freq: Three times a day (TID) | ORAL | 0 refills | Status: DC | PRN

## 2017-01-19 MED ORDER — HYDROCOD POLST-CPM POLST ER 10-8 MG/5ML PO SUER: 5.0000 mL | Freq: Two times a day (BID) | ORAL | 0 refills | Status: DC | PRN

## 2017-01-19 NOTE — Patient Instructions (Addendum)
-   Go downstairs for chest xray - Stop your Lisinopril for now (don't throw it away) - Start Amlodipine  daily for blood pressure - Continue your Xyzal and Flonase - Tessalon every 6 hours as needed for cough - Tussionex at bedtime as needed for cough - Return if no improvement in 3 weeks   Cough, Adult Coughing is a reflex that clears your throat and your airways. Coughing helps to heal and protect your lungs. It is normal to cough occasionally, but a cough that happens with other symptoms or lasts a long time may be a sign of a condition that needs treatment. A cough may last only 2-3 weeks (acute), or it may last longer than 8 weeks (chronic). What are the causes? Coughing is commonly caused by:  Breathing in substances that irritate your lungs.  A viral or bacterial respiratory infection.  Allergies.  Asthma.  Postnasal drip.  Smoking.  Acid backing up from the stomach into the esophagus (gastroesophageal reflux).  Certain medicines.  Chronic lung problems, including COPD (or rarely, lung cancer).  Other medical conditions such as heart failure. Follow these instructions at home: Pay attention to any changes in your symptoms. Take these actions to help with your discomfort:  Take medicines only as told by your health care provider.  If you were prescribed an antibiotic medicine, take it as told by your health care provider. Do not stop taking the antibiotic even if you start to feel better.  Talk with your health care provider before you take a cough suppressant medicine.  Drink enough fluid to keep your urine clear or pale yellow.  If the air is dry, use a cold steam vaporizer or humidifier in your bedroom or your home to help loosen secretions.  Avoid anything that causes you to cough at work or at home.  If your cough is worse at night, try sleeping in a semi-upright position.  Avoid cigarette smoke. If you smoke, quit smoking. If you need help quitting, ask  your health care provider.  Avoid caffeine.  Avoid alcohol.  Rest as needed. Contact a health care provider if:  You have new symptoms.  You cough up pus.  Your cough does not get better after 2-3 weeks, or your cough gets worse.  You cannot control your cough with suppressant medicines and you are losing sleep.  You develop pain that is getting worse or pain that is not controlled with pain medicines.  You have a fever.  You have unexplained weight loss.  You have night sweats. Get help right away if:  You cough up blood.  You have difficulty breathing.  Your heartbeat is very fast. This information is not intended to replace advice given to you by your health care provider. Make sure you discuss any questions you have with your health care provider. Document Released: 03/25/2011 Document Revised: 03/03/2016 Document Reviewed: 12/03/2014 Elsevier Interactive Patient Education  2017 [employer].

## 2017-01-19 NOTE — Progress Notes (Signed)
HPI:                                                                Mary Clarke is a 46 y.o. female who presents to [facility]: Primary Care Sports Medicine today for cough  Cough  This is a new problem. The current episode started 1 to 4 weeks ago (2.5 weeks). The problem has been unchanged. The problem occurs every few minutes. The cough is non-productive. Pertinent negatives include no chills, ear congestion, fever, heartburn, hemoptysis, myalgias, nasal congestion, postnasal drip, rash, rhinorrhea, sore throat, shortness of breath, sweats or wheezing. Exacerbated by: worse at night. Risk factors for lung disease include smoking/tobacco exposure. Treatments tried: nebulizer. Her past medical history is significant for bronchitis and environmental allergies. There is no history of asthma or COPD.  Current 2nd hand smoke exposure. Also recently started an Ace inhibitor 2 months ago. [name] She states nebulizer worsened cough. She did receive the pneumonia vaccine in 12/2016.    Past Medical History:  Diagnosis Date  . Allergy   . Anxiety   . Depression   . Diabetes mellitus without complication (HCC)   . Heart murmur   . Hyperlipidemia   . Hypertension   . PCOS (polycystic ovarian syndrome)   . Sleep apnea    Past Surgical History:  Procedure Laterality Date  . CHOLECYSTECTOMY     Social History  Substance Use Topics  . Smoking status: Former Smoker    Packs/day: 1.00    Years: 15.00    Types: Cigarettes    Quit date: 06/11/2015  . Smokeless tobacco: Never Used     Comment: 2nd hand smoke exposure current  . Alcohol use No   family history includes Aneurysm in her brother; Diabetes in her brother and brother; Heart attack in her father and mother; Hypertension in her brother and mother; Lung cancer in her brother; Stroke in her brother.  ROS: negative except as noted in  the HPI  Medications: Current Outpatient Prescriptions  Medication Sig Dispense Refill  . albuterol (ACCUNEB) 0.63 MG/3ML nebulizer solution Take 1 ampule by nebulization every 6 (six) hours as needed.    Marland Kitchen amLODipine (NORVASC) 5 MG tablet Take 1 tablet (5 mg total) by mouth daily. 30 tablet 3  . atorvastatin (LIPITOR) 40 MG tablet Take 1 tablet (40 mg total) by mouth daily. 90 tablet 3  . BAYER CONTOUR NEXT TEST test strip USE AS DIRECTED 100 each 0  . benzonatate (TESSALON) 200 MG capsule Take 1 capsule (200 mg total) by mouth 3 (three) times daily as needed for cough. 45 capsule 0  . chlorpheniramine-HYDROcodone (TUSSIONEX) 10-8 MG/5ML SUER Take 5 mLs by mouth every 12 (twelve) hours as needed for cough (cough, will cause drowsiness.). 120 mL 0  . Cholecalciferol (VITAMIN D3 PO) Take by mouth.    . fluticasone (FLONASE) 50 MCG/ACT nasal spray Place 2 sprays into both nostrils daily. 16 g 6  . Levocetirizine Dihydrochloride (XYZAL PO) Take by mouth.    . meclizine (ANTIVERT) 25 MG tablet Take 1 tablet (25 mg total) by mouth 3 (three) times daily as needed for dizziness or nausea. 30 tablet 1  .  metFORMIN (GLUCOPHAGE) 500 MG tablet Take 1 tablet (500 mg total) by mouth 2 (two) times daily with a meal. 180 tablet 0  . Multiple Vitamin (MULTIVITAMIN) tablet Take 1 tablet by mouth daily.    . NONFORMULARY OR COMPOUNDED ITEM Glucometer with lancets and strips 1 each 0   No current facility-administered medications for this visit.    No Known Allergies     Objective:  BP 115/76   Pulse 76   Temp 98.1 F (36.7 C) (Oral)   Wt 244 lb (110.7 kg)   BMI 44.63 kg/m  Gen: well-groomed, morbidly obese, cooperative, not ill-appearing, no distress HEENT: normal conjunctiva, wearing glasses, nasal cavities patent, oropharynx clear, moist mucus membranes, no frontal or maxillary sinus tenderness Pulm: Normal work of breathing, normal phonation, lung sounds are distant, no wheezes, rales or rhonchi  appreciated CV: Normal rate, regular rhythm, s1 and s2 distinct, grade 2/6 systolic ejection murmur at the LUSB, no clicks or rubs Neuro: alert and oriented x 3, EOM's intact Lymph: no cervical or tonsillar adenopathy Skin: warm, dry, intact, no rash, no cyanosis  CLINICAL DATA:  Cough, congestion and fatigue with nausea for 4 days. Wheezing and decreased breath sounds in lower lungs.  EXAM: CHEST  2 VIEW  COMPARISON:  None.  FINDINGS: The heart size and mediastinal contours are within normal limits. No pneumonic consolidation or CHF. No effusion or pneumothorax. Minimal bibasilar atelectasis. Degenerate change noted along the visualized dorsal spine with mild multilevel disc space narrowing and osteophytes.  IMPRESSION: No active cardiopulmonary disease.  Minimal bibasilar atelectasis.   Electronically Signed   By: Mary Clarke M.D.   On: 10/04/2016 17:37   Assessment and Plan: 46 y.o. female with   1. Mitral valve insufficiency, unspecified etiology - unable to appreciate severity of MR on initial ECHO, but thought to be mild. Patient is not able to afford co-pay for TEE. She has a strong family history of cardiac problems, including first degree relatives with CAD/hx of MI. I recommended she follow-up with cardiologist to discuss symptoms and risk factors and see what their recommendations are for urgency of TEE - Ambulatory referral to Cardiology  2. Cough - suspect post-viral cough vs. ACEi-induced cough vs. New viral bronchitis. Given that she has no associated symptoms and non-productive cough, differential is strongly on the first two - reviewed CXR from 09/2016, which was negative for cardiopulmonary disease - DG Chest 2 View to r/o infiltrate - cont antihistamine and flonase. Plan to hold ACEi - chlorpheniramine-HYDROcodone (TUSSIONEX) 10-8 MG/5ML SUER; Take 5 mLs by mouth every 12 (twelve) hours as needed for cough (cough, will cause drowsiness.).  Dispense:  120 mL; Refill: 0 - benzonatate (TESSALON) 200 MG capsule; Take 1 capsule (200 mg total) by mouth 3 (three) times daily as needed for cough.  Dispense: 45 capsule; Refill: 0  3. Essential hypertension - holding Lisinopril for now - amLODipine (NORVASC) 5 MG tablet; Take 1 tablet (5 mg total) by mouth daily.  Dispense: 30 tablet; Refill: 3  Patient education and anticipatory guidance given Patient agrees with treatment plan Follow-up if no improvement in 3 weeks or sooner as needed  Levonne Hubert PA-C

## 2017-02-15 NOTE — Progress Notes (Signed)
Mary Clarke, Mary Pho, PA-C Reason for referral-Mitral regurgitation  HPI: 46 yo female for evaluation of mitral regurgitation at request of Carlis Stable, PA-C. Echocardiogram March 2018 showed normal LV function, eccentric mitral regurgitation directed posteriorly and difficult to quantitate but appeared mild. TEE recommended to further assess. This study was performed because of murmur. Patient has dyspnea on exertion but no orthopnea or PND. Occasional mild pedal edema if standing for long periods of time. She has not had palpitations or syncope. She has had intermittent "indigestion". This is described as a burning sensation in her substernal area under stressful situations. No radiation or associated symptoms. Because of the above we were asked to evaluate.   Current Outpatient Prescriptions  Medication Sig Dispense Refill  . albuterol (ACCUNEB) 0.63 MG/3ML nebulizer solution Take 1 ampule by nebulization every 6 (six) hours as needed.    Marland Kitchen amLODipine (NORVASC) 5 MG tablet Take 1 tablet (5 mg total) by mouth daily. 30 tablet 3  . atorvastatin (LIPITOR) 40 MG tablet Take 1 tablet (40 mg total) by mouth daily. 90 tablet 3  . BAYER CONTOUR NEXT TEST test strip USE AS DIRECTED 100 each 0  . benzonatate (TESSALON) 200 MG capsule Take 1 capsule (200 mg total) by mouth 3 (three) times daily as needed for cough. 45 capsule 0  . chlorpheniramine-HYDROcodone (TUSSIONEX) 10-8 MG/5ML SUER Take 5 mLs by mouth every 12 (twelve) hours as needed for cough (cough, will cause drowsiness.). 120 mL 0  . Cholecalciferol (VITAMIN D3 PO) Take by mouth.    . fluticasone (FLONASE) 50 MCG/ACT nasal spray Place 2 sprays into both nostrils daily. 16 g 6  . Levocetirizine Dihydrochloride (XYZAL PO) Take by mouth.    . meclizine (ANTIVERT) 25 MG tablet Take 1 tablet (25 mg total) by mouth 3 (three) times daily as needed for dizziness or nausea. 30 tablet 1  . metFORMIN (GLUCOPHAGE) 500  MG tablet TAKE 1 TABLET (500 MG TOTAL) BY MOUTH 2 (TWO) TIMES DAILY WITH A MEAL. 180 tablet 0  . Multiple Vitamin (MULTIVITAMIN) tablet Take 1 tablet by mouth daily.    . NONFORMULARY OR COMPOUNDED ITEM Glucometer with lancets and strips 1 each 0   No current facility-administered medications for this visit.     No Known Allergies   Past Medical History:  Diagnosis Date  . Allergy   . Anxiety   . Depression   . Diabetes mellitus without complication (HCC)   . Heart murmur   . Hyperlipidemia   . Hypertension   . PCOS (polycystic ovarian syndrome)   . Sleep apnea     Past Surgical History:  Procedure Laterality Date  . CHOLECYSTECTOMY      Social History   Social History  . Marital status: Divorced    Spouse name: N/A  . Number of children: N/A  . Years of education: N/A   Occupational History  .      Retail   Social History Main Topics  . Smoking status: Former Smoker    Packs/day: 1.00    Years: 15.00    Types: Cigarettes    Quit date: 06/11/2015  . Smokeless tobacco: Never Used     Comment: 2nd hand smoke exposure current  . Alcohol use No  . Drug use: No  . Sexual activity: Not Currently   Other Topics Concern  . Not on file   Social History Narrative  . No narrative on file    Family History  Problem Relation Age  of Onset  . Heart attack Mother 4755  . Hypertension Mother   . Heart attack Father 5244  . Diabetes Brother   . Aneurysm Brother   . Hypertension Brother   . Stroke Brother   . Heart attack Brother 43  . Lung cancer Brother   . Diabetes Brother     ROS: no fevers or chills, productive cough, hemoptysis, dysphasia, odynophagia, melena, hematochezia, dysuria, hematuria, rash, seizure activity, orthopnea, PND, pedal edema, claudication. Remaining systems are negative.  Physical Exam:   Blood pressure 117/78, pulse 73, height 5\' 2"  (1.575 m), weight 105.9 kg (233 lb 6.4 oz).  General:  Well developed/obese in NAD Skin  warm/dry Patient not depressed No peripheral clubbing Back-normal HEENT-normal/normal eyelids Neck supple/normal carotid upstroke bilaterally; no bruits; no JVD; no thyromegaly chest - CTA/ normal expansion CV - RRR/normal S1 and S2; no rubs or gallops;  PMI nondisplaced; 2/6 systolic murmur apex. Abdomen -NT/ND, no HSM, no mass, + bowel sounds, no bruit 2+ femoral pulses, no bruits Ext-no edema, chords, 2+ DP Neuro-grossly nonfocal  ECG - Normal sinus rhythm at a rate of 73. No ST changes. personally reviewed  A/P  1 mitral regurgitation-as outlined mechanism and severity unclear based on transthoracic images. She has some dyspnea on exertion. I will arrange a transesophageal echocardiogram to better assess.  2 chest pain-patient has "indigestion" under stressful situations. She has multiple risk factors including strong family history, 25 years of diabetes mellitus, hypertension, hyperlipidemia and prior tobacco use. We will arrange a Lexiscan nuclear study for risk stratification. Her knees will not allow her to exercise on a treadmill.  3 hypertension-blood pressure is controlled. Continue present medications.  4 dyspnea-we will arrange a nuclear study to rule out ischemia as a contributing factor.  5 hyperlipidemia-continue statin.  6 obesity-we discussed the importance of weight loss.   Olga MillersBrian Shaquilla Kehres, MD

## 2017-02-20 ENCOUNTER — Telehealth: Payer: Self-pay

## 2017-02-20 NOTE — Telephone Encounter (Signed)
At pt last visit she was temporarily taken off of lisinopril and placed on Amlodipine.  Should she continue to take the amlodipine or switch back to the lisinopril.  Please advise. -EH/RMA

## 2017-02-22 ENCOUNTER — Ambulatory Visit (INDEPENDENT_AMBULATORY_CARE_PROVIDER_SITE_OTHER): Payer: BLUE CROSS/BLUE SHIELD | Admitting: Cardiology

## 2017-02-22 ENCOUNTER — Encounter: Payer: Self-pay | Admitting: Cardiology

## 2017-02-22 ENCOUNTER — Encounter: Payer: Self-pay | Admitting: *Deleted

## 2017-02-22 VITALS — BP 117/78 | HR 73 | Ht 62.0 in | Wt 233.4 lb

## 2017-02-22 DIAGNOSIS — R072 Precordial pain: Secondary | ICD-10-CM | POA: Diagnosis not present

## 2017-02-22 DIAGNOSIS — I340 Nonrheumatic mitral (valve) insufficiency: Secondary | ICD-10-CM | POA: Diagnosis not present

## 2017-02-22 DIAGNOSIS — R0602 Shortness of breath: Secondary | ICD-10-CM | POA: Diagnosis not present

## 2017-02-22 DIAGNOSIS — E7800 Pure hypercholesterolemia, unspecified: Secondary | ICD-10-CM

## 2017-02-22 DIAGNOSIS — I10 Essential (primary) hypertension: Secondary | ICD-10-CM | POA: Diagnosis not present

## 2017-02-22 NOTE — Patient Instructions (Signed)
Medication Instructions:   NO CHANGE  Testing/Procedures:  Your physician has requested that you have a lexiscan myoview. For further information please visit [email or web address]. Please follow instruction sheet, as given.   Your physician has requested that you have a TEE. During a TEE, sound waves are used to create images of your heart. It provides your doctor with information about the size and shape of your heart and how well your heart's chambers and valves are working. In this test, a transducer is attached to the end of a flexible tube that's guided down your throat and into your esophagus (the tube leading from you mouth to your stomach) to get a more detailed image of your heart. You are not awake for the procedure. Please see the instruction sheet given to you today. For further information please visit [email or web address].     Follow-Up:  Your physician recommends that you schedule a follow-up appointment in: 3 MONTHS WITH DR Jens SomRENSHAW   If you need a refill on your cardiac medications before your next appointment, please call your pharmacy.

## 2017-02-27 NOTE — Telephone Encounter (Signed)
Patient was taken off Lisinopril to see if this was the cause of her cough. This should addressed at her next blood pressure/diabetes follow-up appointment because it is a little too complex to manage over the phone. She should remain on the Amlodipine for now.

## 2017-02-27 NOTE — Telephone Encounter (Signed)
Patient notified and will call back to schedule an appointment.acm

## 2017-03-16 ENCOUNTER — Telehealth (HOSPITAL_COMMUNITY): Payer: Self-pay

## 2017-03-16 NOTE — Telephone Encounter (Signed)
Encounter complete. 

## 2017-03-17 ENCOUNTER — Telehealth (HOSPITAL_COMMUNITY): Payer: Self-pay

## 2017-03-17 NOTE — Telephone Encounter (Signed)
Encounter complete. 

## 2017-03-21 ENCOUNTER — Ambulatory Visit (HOSPITAL_COMMUNITY)
Admission: RE | Admit: 2017-03-21 | Discharge: 2017-03-21 | Disposition: A | Discharge status: Home / Self Care | Payer: BLUE CROSS/BLUE SHIELD | Source: Ambulatory Visit | Attending: Cardiovascular Disease | Admitting: Cardiovascular Disease

## 2017-03-21 DIAGNOSIS — R072 Precordial pain: Secondary | ICD-10-CM | POA: Diagnosis not present

## 2017-03-21 LAB — MYOCARDIAL PERFUSION IMAGING
CHL CUP NUCLEAR SSS: 8
LVDIAVOL: 106 mL (ref 46–106)
LVSYSVOL: 44 mL
NUC STRESS TID: 1.06
Peak HR: 97 {beats}/min
Rest HR: 74 {beats}/min
SDS: 4
SRS: 4

## 2017-03-21 MED ORDER — TECHNETIUM TC 99M TETROFOSMIN IV KIT
31.2000 | PACK | Freq: Once | INTRAVENOUS | Status: AC | PRN
  Administered 2017-03-21: 31.2 via INTRAVENOUS
  Filled 2017-03-21: qty 32

## 2017-03-21 MED ORDER — REGADENOSON 0.4 MG/5ML IV SOLN
0.4000 mg | Freq: Once | INTRAVENOUS | Status: AC
  Administered 2017-03-21: 0.4 mg via INTRAVENOUS

## 2017-03-21 MED ORDER — TECHNETIUM TC 99M TETROFOSMIN IV KIT
10.2000 | PACK | Freq: Once | INTRAVENOUS | Status: AC | PRN
  Administered 2017-03-21: 10.2 via INTRAVENOUS
  Filled 2017-03-21: qty 11

## 2017-03-22 ENCOUNTER — Inpatient Hospital Stay (HOSPITAL_COMMUNITY): Admission: RE | Admit: 2017-03-22 | Payer: BLUE CROSS/BLUE SHIELD | Source: Ambulatory Visit

## 2017-03-22 ENCOUNTER — Telehealth: Payer: Self-pay | Admitting: *Deleted

## 2017-03-22 DIAGNOSIS — R9439 Abnormal result of other cardiovascular function study: Secondary | ICD-10-CM

## 2017-03-22 NOTE — Telephone Encounter (Signed)
Schedule cath after TEE 6/29 (same day); make sure no dye allergy Olga MillersBrian Nancy Manuele

## 2017-03-22 NOTE — Telephone Encounter (Addendum)
-----   Message from Lewayne BuntingBrian S Crenshaw, MD sent at 03/22/2017  7:17 AM EDT ----- Arrange PAOV for cath Mary MillersBrian Crenshaw   Left message for pt to call

## 2017-03-22 NOTE — Telephone Encounter (Signed)
Follow up     ° ° ° ° °Pt is returning call to Debra.  °

## 2017-03-22 NOTE — Telephone Encounter (Signed)
Spoke with pt, aware of abnormal myoview test and need for cath. She [place], transportation is a hugh issue for her. She is familiar with the cath due to family members having this done. She is scheduled for TEE 04-07-17. She would like to have someone call her to discuss and then just set up the cath if that is okay with dr Jens Somcrenshaw. Will forward for dr Jens Somcrenshaw review

## 2017-03-23 NOTE — Telephone Encounter (Signed)
The patient has been called back and informed that the cath will be scheduled after her TEE on 6/29. She will be receiving a call with further instruction. She verbalized her understanding.

## 2017-03-23 NOTE — Telephone Encounter (Signed)
Follow up   Pt verbalized that she is calling back pertaining to the call 03-22-2017 about Cath and   she needs a call back before 1pm today if not leave a  Detailed vm on her phone.

## 2017-03-27 ENCOUNTER — Encounter: Payer: Self-pay | Admitting: *Deleted

## 2017-03-27 ENCOUNTER — Other Ambulatory Visit: Payer: Self-pay | Admitting: *Deleted

## 2017-03-27 DIAGNOSIS — R9439 Abnormal result of other cardiovascular function study: Secondary | ICD-10-CM

## 2017-03-27 DIAGNOSIS — Z01818 Encounter for other preprocedural examination: Secondary | ICD-10-CM

## 2017-03-27 NOTE — Telephone Encounter (Signed)
Spoke with pt, cath is scheduled for 04-07-17 @ 10:30 am with dr Katrinka Blazingsmith following her TEE. She will get lab work at the [facility] lab in [place] prior to the procedure. Instructions discussed in detail with the patient. Will generate instruction letter and mail to the patient with lab orders.

## 2017-03-27 NOTE — Addendum Note (Signed)
Addended by: Freddi StarrMATHIS, Nihal Doan W on: 03/27/2017 01:52 PM   Modules accepted: Orders

## 2017-03-30 DIAGNOSIS — R9439 Abnormal result of other cardiovascular function study: Secondary | ICD-10-CM | POA: Diagnosis not present

## 2017-03-30 DIAGNOSIS — Z01818 Encounter for other preprocedural examination: Secondary | ICD-10-CM | POA: Diagnosis not present

## 2017-03-30 LAB — BASIC METABOLIC PANEL
BUN: 14 mg/dL (ref 7–25)
CO2: 28 mmol/L (ref 20–31)
Calcium: 8.9 mg/dL (ref 8.6–10.2)
Chloride: 104 mmol/L (ref 98–110)
Creat: 0.61 mg/dL (ref 0.50–1.10)
Glucose, Bld: 106 mg/dL — ABNORMAL HIGH (ref 65–99)
POTASSIUM: 3.8 mmol/L (ref 3.5–5.3)
SODIUM: 141 mmol/L (ref 135–146)

## 2017-03-30 LAB — CBC
HEMATOCRIT: 42.5 % (ref 35.0–45.0)
HEMOGLOBIN: 14.5 g/dL (ref 11.7–15.5)
MCH: 31.7 pg (ref 27.0–33.0)
MCHC: 34.1 g/dL (ref 32.0–36.0)
MCV: 93 fL (ref 80.0–100.0)
MPV: 11 fL (ref 7.5–12.5)
Platelets: 255 10*3/uL (ref 140–400)
RBC: 4.57 MIL/uL (ref 3.80–5.10)
RDW: 13.6 % (ref 11.0–15.0)
WBC: 7.6 10*3/uL (ref 3.8–10.8)

## 2017-03-30 LAB — PROTIME-INR
INR: 1
Prothrombin Time: 10.4 s (ref 9.0–11.5)

## 2017-04-06 ENCOUNTER — Telehealth: Payer: Self-pay

## 2017-04-06 DIAGNOSIS — R9439 Abnormal result of other cardiovascular function study: Secondary | ICD-10-CM

## 2017-04-06 NOTE — Telephone Encounter (Signed)
Call placed to Pt to provide pre cath instruction.  Call went to name identified VM.  Left message stating patient should not take Metformin today.    Left this nurse name and # for call back to finalize pre cath instruction.

## 2017-04-06 NOTE — Telephone Encounter (Signed)
Call back received from Pt. Patient [facility] scheduled for: 04/07/2017 @ 1030 with TEE prior  Verified arrival time and place: NT @ 0800.  Asked Pt to try to arrive by 0800.  Pt states she will try.  Confirmed AM meds to be taken pre-cath with sip of water:  Pt states she took Metformin today @ 0600.  Notified Pt to not take any Metformin tonight or tomorrow morning.  Advised Pt to take baby ASA prior to arrival tomorrow.  Pt indicates understanding.  Confirmed patient has responsible person to drive home post procedure and observe patient for 24 hours:  Pt states her sisters will be available

## 2017-04-07 ENCOUNTER — Ambulatory Visit (HOSPITAL_BASED_OUTPATIENT_CLINIC_OR_DEPARTMENT_OTHER)
Admission: RE | Admit: 2017-04-07 | Discharge: 2017-04-07 | Disposition: A | Discharge status: Home / Self Care | Payer: BLUE CROSS/BLUE SHIELD | Source: Ambulatory Visit | Attending: Cardiology | Admitting: Cardiology

## 2017-04-07 ENCOUNTER — Ambulatory Visit (HOSPITAL_COMMUNITY)
Admission: RE | Admit: 2017-04-07 | Discharge: 2017-04-07 | Disposition: A | Discharge status: Home / Self Care | Payer: BLUE CROSS/BLUE SHIELD | Source: Ambulatory Visit | Attending: Cardiology | Admitting: Cardiology

## 2017-04-07 ENCOUNTER — Encounter (HOSPITAL_COMMUNITY)
Admission: RE | Disposition: A | Discharge status: Home / Self Care | Payer: Self-pay | Source: Ambulatory Visit | Attending: Cardiology

## 2017-04-07 ENCOUNTER — Encounter (HOSPITAL_COMMUNITY): Payer: Self-pay | Admitting: Cardiology

## 2017-04-07 DIAGNOSIS — I2582 Chronic total occlusion of coronary artery: Secondary | ICD-10-CM | POA: Insufficient documentation

## 2017-04-07 DIAGNOSIS — Z87891 Personal history of nicotine dependence: Secondary | ICD-10-CM | POA: Diagnosis not present

## 2017-04-07 DIAGNOSIS — E282 Polycystic ovarian syndrome: Secondary | ICD-10-CM | POA: Insufficient documentation

## 2017-04-07 DIAGNOSIS — E669 Obesity, unspecified: Secondary | ICD-10-CM | POA: Diagnosis not present

## 2017-04-07 DIAGNOSIS — I10 Essential (primary) hypertension: Secondary | ICD-10-CM | POA: Insufficient documentation

## 2017-04-07 DIAGNOSIS — E1159 Type 2 diabetes mellitus with other circulatory complications: Secondary | ICD-10-CM | POA: Diagnosis present

## 2017-04-07 DIAGNOSIS — E119 Type 2 diabetes mellitus without complications: Secondary | ICD-10-CM | POA: Diagnosis not present

## 2017-04-07 DIAGNOSIS — G4730 Sleep apnea, unspecified: Secondary | ICD-10-CM | POA: Diagnosis not present

## 2017-04-07 DIAGNOSIS — Z7951 Long term (current) use of inhaled steroids: Secondary | ICD-10-CM | POA: Diagnosis not present

## 2017-04-07 DIAGNOSIS — Z7984 Long term (current) use of oral hypoglycemic drugs: Secondary | ICD-10-CM | POA: Insufficient documentation

## 2017-04-07 DIAGNOSIS — E1169 Type 2 diabetes mellitus with other specified complication: Secondary | ICD-10-CM | POA: Diagnosis present

## 2017-04-07 DIAGNOSIS — I25118 Atherosclerotic heart disease of native coronary artery with other forms of angina pectoris: Secondary | ICD-10-CM | POA: Insufficient documentation

## 2017-04-07 DIAGNOSIS — F419 Anxiety disorder, unspecified: Secondary | ICD-10-CM | POA: Insufficient documentation

## 2017-04-07 DIAGNOSIS — R9439 Abnormal result of other cardiovascular function study: Secondary | ICD-10-CM

## 2017-04-07 DIAGNOSIS — Z8249 Family history of ischemic heart disease and other diseases of the circulatory system: Secondary | ICD-10-CM | POA: Diagnosis not present

## 2017-04-07 DIAGNOSIS — Z6841 Body Mass Index (BMI) 40.0 and over, adult: Secondary | ICD-10-CM | POA: Diagnosis not present

## 2017-04-07 DIAGNOSIS — F329 Major depressive disorder, single episode, unspecified: Secondary | ICD-10-CM | POA: Diagnosis not present

## 2017-04-07 DIAGNOSIS — Z7722 Contact with and (suspected) exposure to environmental tobacco smoke (acute) (chronic): Secondary | ICD-10-CM | POA: Insufficient documentation

## 2017-04-07 DIAGNOSIS — R0600 Dyspnea, unspecified: Secondary | ICD-10-CM | POA: Diagnosis not present

## 2017-04-07 DIAGNOSIS — I340 Nonrheumatic mitral (valve) insufficiency: Secondary | ICD-10-CM | POA: Diagnosis present

## 2017-04-07 DIAGNOSIS — I152 Hypertension secondary to endocrine disorders: Secondary | ICD-10-CM | POA: Diagnosis present

## 2017-04-07 DIAGNOSIS — E785 Hyperlipidemia, unspecified: Secondary | ICD-10-CM | POA: Insufficient documentation

## 2017-04-07 DIAGNOSIS — R072 Precordial pain: Secondary | ICD-10-CM

## 2017-04-07 HISTORY — DX: Unspecified chronic bronchitis: J42

## 2017-04-07 HISTORY — PX: RIGHT/LEFT HEART CATH AND CORONARY ANGIOGRAPHY: CATH118266

## 2017-04-07 HISTORY — PX: TEE WITHOUT CARDIOVERSION: SHX5443

## 2017-04-07 LAB — POCT I-STAT 3, VENOUS BLOOD GAS (G3P V)
ACID-BASE EXCESS: 1 mmol/L (ref 0.0–2.0)
BICARBONATE: 26.7 mmol/L (ref 20.0–28.0)
Bicarbonate: 26.7 mmol/L (ref 20.0–28.0)
O2 Saturation: 79 %
O2 Saturation: 80 %
PCO2 VEN: 49.3 mmHg (ref 44.0–60.0)
PH VEN: 7.342 (ref 7.250–7.430)
PH VEN: 7.353 (ref 7.250–7.430)
TCO2: 28 mmol/L (ref 0–100)
TCO2: 28 mmol/L (ref 0–100)
pCO2, Ven: 48 mmHg (ref 44.0–60.0)
pO2, Ven: 46 mmHg — ABNORMAL HIGH (ref 32.0–45.0)
pO2, Ven: 47 mmHg — ABNORMAL HIGH (ref 32.0–45.0)

## 2017-04-07 LAB — POCT I-STAT 3, ART BLOOD GAS (G3+)
ACID-BASE EXCESS: 1 mmol/L (ref 0.0–2.0)
BICARBONATE: 26.4 mmol/L (ref 20.0–28.0)
Bicarbonate: 27.5 mmol/L (ref 20.0–28.0)
O2 SAT: 99 %
O2 Saturation: 76 %
PCO2 ART: 49.1 mmHg — AB (ref 32.0–48.0)
PH ART: 7.339 — AB (ref 7.350–7.450)
PO2 ART: 44 mmHg — AB (ref 83.0–108.0)
TCO2: 28 mmol/L (ref 0–100)
TCO2: 29 mmol/L (ref 0–100)
pCO2 arterial: 47.8 mmHg (ref 32.0–48.0)
pH, Arterial: 7.367 (ref 7.350–7.450)
pO2, Arterial: 137 mmHg — ABNORMAL HIGH (ref 83.0–108.0)

## 2017-04-07 LAB — PREGNANCY, URINE: PREG TEST UR: NEGATIVE

## 2017-04-07 SURGERY — ECHOCARDIOGRAM, TRANSESOPHAGEAL
Anesthesia: Moderate Sedation

## 2017-04-07 SURGERY — RIGHT/LEFT HEART CATH AND CORONARY ANGIOGRAPHY
Anesthesia: LOCAL | Laterality: Right

## 2017-04-07 MED ORDER — SODIUM CHLORIDE 0.9 % IV SOLN: 250.0000 mL | INTRAVENOUS | Status: DC | PRN

## 2017-04-07 MED ORDER — MIDAZOLAM HCL 2 MG/2ML IJ SOLN
INTRAMUSCULAR | Status: DC | PRN
  Administered 2017-04-07 (×2): 1 mg via INTRAVENOUS

## 2017-04-07 MED ORDER — SODIUM CHLORIDE 0.9 % WEIGHT BASED INFUSION: 1.0000 mL/kg/h | INTRAVENOUS | Status: DC

## 2017-04-07 MED ORDER — MIDAZOLAM HCL 2 MG/2ML IJ SOLN
INTRAMUSCULAR | Status: AC
  Filled 2017-04-07: qty 2

## 2017-04-07 MED ORDER — MIDAZOLAM HCL 5 MG/ML IJ SOLN
INTRAMUSCULAR | Status: AC
  Filled 2017-04-07: qty 2

## 2017-04-07 MED ORDER — IOPAMIDOL (ISOVUE-370) INJECTION 76%
INTRAVENOUS | Status: DC | PRN
  Administered 2017-04-07: 135 mL via INTRA_ARTERIAL

## 2017-04-07 MED ORDER — SODIUM CHLORIDE 0.9 % IV SOLN
INTRAVENOUS | Status: DC
  Administered 2017-04-07: 13:00:00 via INTRAVENOUS

## 2017-04-07 MED ORDER — IOPAMIDOL (ISOVUE-370) INJECTION 76%
INTRAVENOUS | Status: AC
  Filled 2017-04-07: qty 50

## 2017-04-07 MED ORDER — LIDOCAINE HCL (PF) 1 % IJ SOLN
INTRAMUSCULAR | Status: DC | PRN
  Administered 2017-04-07 (×2): 2 mL via INTRADERMAL

## 2017-04-07 MED ORDER — SODIUM CHLORIDE 0.9% FLUSH: 3.0000 mL | INTRAVENOUS | Status: DC | PRN

## 2017-04-07 MED ORDER — BUTAMBEN-TETRACAINE-BENZOCAINE 2-2-14 % EX AERO
INHALATION_SPRAY | CUTANEOUS | Status: DC | PRN
  Administered 2017-04-07: 2 via TOPICAL

## 2017-04-07 MED ORDER — FENTANYL CITRATE (PF) 100 MCG/2ML IJ SOLN
INTRAMUSCULAR | Status: DC | PRN
  Administered 2017-04-07: 50 ug via INTRAVENOUS

## 2017-04-07 MED ORDER — HEPARIN SODIUM (PORCINE) 1000 UNIT/ML IJ SOLN
INTRAMUSCULAR | Status: AC
  Filled 2017-04-07: qty 1

## 2017-04-07 MED ORDER — FENTANYL CITRATE (PF) 100 MCG/2ML IJ SOLN
INTRAMUSCULAR | Status: DC | PRN
  Administered 2017-04-07 (×2): 25 ug via INTRAVENOUS

## 2017-04-07 MED ORDER — VERAPAMIL HCL 2.5 MG/ML IV SOLN
INTRAVENOUS | Status: AC
  Filled 2017-04-07: qty 2

## 2017-04-07 MED ORDER — IOPAMIDOL (ISOVUE-370) INJECTION 76%
INTRAVENOUS | Status: AC
  Filled 2017-04-07: qty 100

## 2017-04-07 MED ORDER — HEPARIN (PORCINE) IN NACL 2-0.9 UNIT/ML-% IJ SOLN
INTRAMUSCULAR | Status: AC
  Filled 2017-04-07: qty 1000

## 2017-04-07 MED ORDER — FENTANYL CITRATE (PF) 100 MCG/2ML IJ SOLN
INTRAMUSCULAR | Status: AC
  Filled 2017-04-07: qty 2

## 2017-04-07 MED ORDER — LIDOCAINE HCL (PF) 1 % IJ SOLN
INTRAMUSCULAR | Status: AC
  Filled 2017-04-07: qty 30

## 2017-04-07 MED ORDER — SODIUM CHLORIDE 0.9 % WEIGHT BASED INFUSION: 3.0000 mL/kg/h | INTRAVENOUS | Status: DC

## 2017-04-07 MED ORDER — DIPHENHYDRAMINE HCL 50 MG/ML IJ SOLN
INTRAMUSCULAR | Status: AC
  Filled 2017-04-07: qty 1

## 2017-04-07 MED ORDER — SODIUM CHLORIDE 0.9 % IV SOLN: INTRAVENOUS | Status: DC

## 2017-04-07 MED ORDER — VERAPAMIL HCL 2.5 MG/ML IV SOLN
INTRAVENOUS | Status: DC | PRN
  Administered 2017-04-07: 10 mL via INTRA_ARTERIAL

## 2017-04-07 MED ORDER — ASPIRIN 81 MG PO CHEW
81.0000 mg | CHEWABLE_TABLET | ORAL | Status: AC
  Administered 2017-04-07: 81 mg via ORAL

## 2017-04-07 MED ORDER — HEPARIN (PORCINE) IN NACL 2-0.9 UNIT/ML-% IJ SOLN
INTRAMUSCULAR | Status: AC | PRN
  Administered 2017-04-07: 1000 mL

## 2017-04-07 MED ORDER — MIDAZOLAM HCL 10 MG/2ML IJ SOLN
INTRAMUSCULAR | Status: DC | PRN
  Administered 2017-04-07 (×2): 2 mg via INTRAVENOUS

## 2017-04-07 MED ORDER — SODIUM CHLORIDE 0.9% FLUSH: 3.0000 mL | Freq: Two times a day (BID) | INTRAVENOUS | Status: DC

## 2017-04-07 MED ORDER — HEPARIN SODIUM (PORCINE) 1000 UNIT/ML IJ SOLN
INTRAMUSCULAR | Status: DC | PRN
  Administered 2017-04-07: 5000 [IU] via INTRAVENOUS

## 2017-04-07 SURGICAL SUPPLY — 15 items
CATH 5FR JL3.5 JR4 ANG PIG MP (CATHETERS) ×3 IMPLANT
CATH BALLN WEDGE 5F 110CM (CATHETERS) ×3 IMPLANT
COVER PRB 48X5XTLSCP FOLD TPE (BAG) ×2 IMPLANT
COVER PROBE 5X48 (BAG) ×1
DEVICE RAD COMP TR BAND LRG (VASCULAR PRODUCTS) ×3 IMPLANT
GLIDESHEATH SLEND A-KIT 6F 22G (SHEATH) ×3 IMPLANT
GUIDEWIRE INQWIRE 1.5J.035X260 (WIRE) ×2 IMPLANT
INQWIRE 1.5J .035X260CM (WIRE) ×3
KIT HEART LEFT (KITS) ×3 IMPLANT
PACK CARDIAC CATHETERIZATION (CUSTOM PROCEDURE TRAY) ×3 IMPLANT
SHEATH GLIDE SLENDER 4/5FR (SHEATH) ×3 IMPLANT
SYR MEDRAD MARK V 150ML (SYRINGE) ×3 IMPLANT
TRANSDUCER W/STOPCOCK (MISCELLANEOUS) ×3 IMPLANT
TUBING CIL FLEX 10 FLL-RA (TUBING) ×3 IMPLANT
WIRE HI TORQ VERSACORE-J 145CM (WIRE) ×3 IMPLANT

## 2017-04-07 NOTE — CV Procedure (Signed)
    Transesophageal Echocardiogram Note  Mary MajorLisa Clarke [id number] 03-07-1971  Procedure: Transesophageal Echocardiogram Indications: MR  Procedure Details Consent: Obtained Time Out: Verified patient identification, verified procedure, site/side was marked, verified correct patient position, special equipment/implants available, Radiology Safety Procedures followed,  medications/allergies/relevent history reviewed, required imaging and test results available.  Performed  Medications:  During this procedure the patient is administered a total of Versed 4 mg and Fentanyl 50 mcg  to achieve and maintain moderate conscious sedation.  The patient's heart rate, blood pressure, and oxygen saturation are monitored continuously during the procedure. The period of conscious sedation is 30 minutes, of which I was present face-to-face 100% of this time.  Normal LV function; possible prolapse of anterior MV leaflet with severe eccentric MR   Complications: No apparent complications Patient did tolerate procedure well.  Olga MillersBrian Crenshaw, MD

## 2017-04-07 NOTE — H&P (Signed)
Office Visit   02/22/2017 [facility] Rene Kocher, MD  Cardiology   Precordial pain +4 more  Dx   New Patient (Initial Visit) ; Referred by Carlis Stable, PA-C  Reason for Visit   Additional Documentation   Vitals:   BP 117/78   Pulse 73   Ht 5\' 2"  (1.575 m)   Wt 105.9 kg (233 lb 6.4 oz)   BMI 42.69 kg/m   BSA 2.15 m      More Vitals   Flowsheets:   Custom Formula Data,   Anthropometrics,   MEWS Score     Encounter Info:   Billing Info,   History,   Allergies,   Detailed Report     All Notes   Procedures by Lewayne Bunting, MD at 02/24/2017 4:12 PM   Author: Lewayne Bunting, MD Author Type: Physician Filed: 02/24/2017 4:12 PM  Note Status: Signed Cosign: Cosign Not Required Encounter Date: 02/22/2017  Editor: Olean Ree        Scan on 02/24/2017 4:12 PM by Darci Needle  Progress Notes by Lewayne Bunting, MD at 02/22/2017 4:15 PM   Author: Lewayne Bunting, MD Author Type: Physician Filed: 02/22/2017 4:31 PM  Note Status: Signed Cosign: Cosign Not Required Encounter Date: 02/22/2017  Editor: Lewayne Bunting, MD (Physician)       Referring-Cummings, Bernerd Pho, PA-C Reason for referral-Mitral regurgitation  HPI: 46 yo female for evaluation of mitral regurgitation at request of Carlis Stable, PA-C. Echocardiogram March 2018 showed normal LV function, eccentric mitral regurgitation directed posteriorly and difficult to quantitate but appeared mild. TEE recommended to further assess. This study was performed because of murmur. Patient has dyspnea on exertion but no orthopnea or PND. Occasional mild pedal edema if standing for long periods of time. She has not had palpitations or syncope. She has had intermittent "indigestion". This is described as a burning sensation in her substernal area under stressful situations. No radiation or associated symptoms. Because of the  above we were asked to evaluate.         Current Outpatient Prescriptions  Medication Sig Dispense Refill  . albuterol (ACCUNEB) 0.63 MG/3ML nebulizer solution Take 1 ampule by nebulization every 6 (six) hours as needed.    Marland Kitchen amLODipine (NORVASC) 5 MG tablet Take 1 tablet (5 mg total) by mouth daily. 30 tablet 3  . atorvastatin (LIPITOR) 40 MG tablet Take 1 tablet (40 mg total) by mouth daily. 90 tablet 3  . BAYER CONTOUR NEXT TEST test strip USE AS DIRECTED 100 each 0  . benzonatate (TESSALON) 200 MG capsule Take 1 capsule (200 mg total) by mouth 3 (three) times daily as needed for cough. 45 capsule 0  . chlorpheniramine-HYDROcodone (TUSSIONEX) 10-8 MG/5ML SUER Take 5 mLs by mouth every 12 (twelve) hours as needed for cough (cough, will cause drowsiness.). 120 mL 0  . Cholecalciferol (VITAMIN D3 PO) Take by mouth.    . fluticasone (FLONASE) 50 MCG/ACT nasal spray Place 2 sprays into both nostrils daily. 16 g 6  . Levocetirizine Dihydrochloride (XYZAL PO) Take by mouth.    . meclizine (ANTIVERT) 25 MG tablet Take 1 tablet (25 mg total) by mouth 3 (three) times daily as needed for dizziness or nausea. 30 tablet 1  . metFORMIN (GLUCOPHAGE) 500 MG tablet TAKE 1 TABLET (500 MG TOTAL) BY MOUTH 2 (TWO) TIMES DAILY WITH A MEAL. 180 tablet 0  . Multiple Vitamin (MULTIVITAMIN) tablet Take  1 tablet by mouth daily.    . NONFORMULARY OR COMPOUNDED ITEM Glucometer with lancets and strips 1 each 0   No current facility-administered medications for this visit.     No Known Allergies       Past Medical History:  Diagnosis Date  . Allergy   . Anxiety   . Depression   . Diabetes mellitus without complication (HCC)   . Heart murmur   . Hyperlipidemia   . Hypertension   . PCOS (polycystic ovarian syndrome)   . Sleep apnea          Past Surgical History:  Procedure Laterality Date  . CHOLECYSTECTOMY      Social History        Social History  . Marital status:  Divorced    Spouse name: N/A  . Number of children: N/A  . Years of education: N/A        Occupational History  .      Retail         Social History Main Topics  . Smoking status: Former Smoker    Packs/day: 1.00    Years: 15.00    Types: Cigarettes    Quit date: 06/11/2015  . Smokeless tobacco: Never Used     Comment: 2nd hand smoke exposure current  . Alcohol use No  . Drug use: No  . Sexual activity: Not Currently       Other Topics Concern  . Not on file      Social History Narrative  . No narrative on file         Family History  Problem Relation Age of Onset  . Heart attack Mother 2055  . Hypertension Mother   . Heart attack Father 6144  . Diabetes Brother   . Aneurysm Brother   . Hypertension Brother   . Stroke Brother   . Heart attack Brother 43  . Lung cancer Brother   . Diabetes Brother     ROS: no fevers or chills, productive cough, hemoptysis, dysphasia, odynophagia, melena, hematochezia, dysuria, hematuria, rash, seizure activity, orthopnea, PND, pedal edema, claudication. Remaining systems are negative.  Physical Exam:   Blood pressure 117/78, pulse 73, height 5\' 2"  (1.575 m), weight 105.9 kg (233 lb 6.4 oz).  General:  Well developed/obese in NAD Skin warm/dry Patient not depressed No peripheral clubbing Back-normal HEENT-normal/normal eyelids Neck supple/normal carotid upstroke bilaterally; no bruits; no JVD; no thyromegaly chest - CTA/ normal expansion CV - RRR/normal S1 and S2; no rubs or gallops;  PMI nondisplaced; 2/6 systolic murmur apex. Abdomen -NT/ND, no HSM, no mass, + bowel sounds, no bruit 2+ femoral pulses, no bruits Ext-no edema, chords, 2+ DP Neuro-grossly nonfocal  ECG - Normal sinus rhythm at a rate of 73. No ST changes. personally reviewed  A/P  1 mitral regurgitation-as outlined mechanism and severity unclear based on transthoracic images. She has some dyspnea on exertion. I will  arrange a transesophageal echocardiogram to better assess.  2 chest pain-patient has "indigestion" under stressful situations. She has multiple risk factors including strong family history, 25 years of diabetes mellitus, hypertension, hyperlipidemia and prior tobacco use. We will arrange a Lexiscan nuclear study for risk stratification. Her knees will not allow her to exercise on a treadmill.  3 hypertension-blood pressure is controlled. Continue present medications.  4 dyspnea-we will arrange a nuclear study to rule out ischemia as a contributing factor.  5 hyperlipidemia-continue statin.  6 obesity-we discussed the importance of weight loss.  Olga Millers, MD     Nuclear study showed inferior and distal anterior/apical ischemia; Plan for TEE to assess severity of MR and cath to follow; risks and benefits including MI, CVA and death discussed and she agrees to proceed. Hold glucophage 48 hrs following cath. Olga Millers, MD

## 2017-04-07 NOTE — Progress Notes (Signed)
  Echocardiogram Echocardiogram Transesophageal has been performed.  Delcie RochENNINGTON, Saraiyah Hemminger 04/07/2017, 9:49 AM

## 2017-04-07 NOTE — Interval H&P Note (Signed)
Cath Lab Visit (complete for each Cath Lab visit)  Clinical Evaluation Leading to the Procedure:   ACS: No.  Non-ACS:    Anginal Classification: CCS III  Anti-ischemic medical therapy: Minimal Therapy (1 class of medications)  Non-Invasive Test Results: High-risk stress test findings: cardiac mortality >3%/year  Prior CABG: No previous CABG      History and Physical Interval Note:  04/07/2017 11:14 AM  Mary Clarke  has presented today for surgery, with the diagnosis of abn nuc study  The various methods of treatment have been discussed with the patient and family. After consideration of risks, benefits and other options for treatment, the patient has consented to  Procedure(s): Left Heart Cath and Coronary Angiography (N/A) as a surgical intervention .  The patient's history has been reviewed, patient examined, no change in status, stable for surgery.  I have reviewed the patient's chart and labs.  Questions were answered to the patient's satisfaction.     Lyn RecordsHenry W Chenise Mulvihill III

## 2017-04-07 NOTE — Discharge Instructions (Signed)
TEE ° °YOU HAD AN CARDIAC PROCEDURE TODAY: Refer to the procedure report and other information in the discharge instructions given to you for any specific questions about what was found during the examination. If this information does not answer your questions, please call [facility] office at [phone] to clarify.  ° °DIET: Your first meal following the procedure should be a light meal and then it is ok to progress to your normal diet. A half-sandwich or bowl of soup is an example of a good first meal. Heavy or fried foods are harder to digest and may make you feel nauseous or bloated. Drink plenty of fluids but you should avoid alcoholic beverages for 24 hours. If you had a esophageal dilation, please see attached instructions for diet.  ° °ACTIVITY: Your care partner should take you home directly after the procedure. You should plan to take it easy, moving slowly for the rest of the day. You can resume normal activity the day after the procedure however YOU SHOULD NOT DRIVE, use power tools, machinery or perform tasks that involve climbing or major physical exertion for 24 hours (because of the sedation medicines used during the test).  ° °SYMPTOMS TO REPORT IMMEDIATELY: °A cardiologist can be reached at any hour. Please call [phone] for any of the following symptoms:  °Vomiting of blood or coffee ground material  °New, significant abdominal pain  °New, significant chest pain or pain under the shoulder blades  °Painful or persistently difficult swallowing  °New shortness of breath  °Black, tarry-looking or red, bloody stools ° °FOLLOW UP:  °Please also call with any specific questions about appointments or follow up tests. ° ° °Radial Site Care °Refer to this sheet in the next few weeks. These instructions provide you with information about caring for yourself after your procedure. Your health care provider may also give you more specific instructions. Your treatment has been planned according to  current medical practices, but problems sometimes occur. Call your health care provider if you have any problems or questions after your procedure. °What can I expect after the procedure? °After your procedure, it is typical to have the following: °· Bruising at the radial site that usually fades within 1-2 weeks. °· Blood collecting in the tissue (hematoma) that may be painful to the touch. It should usually decrease in size and tenderness within 1-2 weeks. ° °Follow these instructions at home: °· Take medicines only as directed by your health care provider. °· You may shower 24-48 hours after the procedure or as directed by your health care provider. Remove the bandage (dressing) and gently wash the site with plain soap and water. Pat the area dry with a clean towel. Do not rub the site, because this may cause bleeding. °· Do not take baths, swim, or use a hot tub until your health care provider approves. °· Check your insertion site every day for redness, swelling, or drainage. °· Do not apply powder or lotion to the site. °· Do not flex or bend the affected arm for 24 hours or as directed by your health care provider. °· Do not push or pull heavy objects with the affected arm for 24 hours or as directed by your health care provider. °· Do not lift over 10 lb (4.5 kg) for 5 days after your procedure or as directed by your health care provider. °· Ask your health care provider when it is okay to: °? Return to work or school. °? Resume usual physical activities or sports. °?   Resume sexual activity. °· Do not drive home if you are discharged the same day as the procedure. Have someone else drive you. °· You may drive 24 hours after the procedure unless otherwise instructed by your health care provider. °· Do not operate machinery or power tools for 24 hours after the procedure. °· If your procedure was done as an outpatient procedure, which means that you went home the same day as your procedure, a responsible adult  should be with you for the first 24 hours after you arrive home. °· Keep all follow-up visits as directed by your health care provider. This is important. °Contact a health care provider if: °· You have a fever. °· You have chills. °· You have increased bleeding from the radial site. Hold pressure on the site. °Get help right away if: °· You have unusual pain at the radial site. °· You have redness, warmth, or swelling at the radial site. °· You have drainage (other than a small amount of blood on the dressing) from the radial site. °· The radial site is bleeding, and the bleeding does not stop after 30 minutes of holding steady pressure on the site. °· Your arm or hand becomes pale, cool, tingly, or numb. °This information is not intended to replace advice given to you by your health care provider. Make sure you discuss any questions you have with your health care provider. °Document Released: 10/29/2010 Document Revised: 03/03/2016 Document Reviewed: 04/14/2014 °Elsevier Interactive Patient Education © 2018 [employer] ° ° ° °

## 2017-04-07 NOTE — Interval H&P Note (Signed)
History and Physical Interval Note:  04/07/2017 8:20 AM  Mary Clarke  has presented today for surgery, with the diagnosis of MITRAL VALVE DISEASE  The various methods of treatment have been discussed with the patient and family. After consideration of risks, benefits and other options for treatment, the patient has consented to  Procedure(s): TRANSESOPHAGEAL ECHOCARDIOGRAM (TEE) (N/A) as a surgical intervention .  The patient's history has been reviewed, patient examined, no change in status, stable for surgery.  I have reviewed the patient's chart and labs.  Questions were answered to the patient's satisfaction.     Olga MillersBrian Brynlee Pennywell

## 2017-04-09 ENCOUNTER — Encounter (HOSPITAL_COMMUNITY): Payer: Self-pay | Admitting: Cardiology

## 2017-04-10 ENCOUNTER — Encounter (HOSPITAL_COMMUNITY): Payer: Self-pay | Admitting: Interventional Cardiology

## 2017-04-10 ENCOUNTER — Telehealth: Payer: Self-pay | Admitting: Cardiology

## 2017-04-10 NOTE — Telephone Encounter (Signed)
Left message for pt to call.

## 2017-04-10 NOTE — Telephone Encounter (Signed)
Ok to return to work Mary Clarke  

## 2017-04-10 NOTE — Telephone Encounter (Signed)
New Message     Pt is returning call about starting a new medication ?    Needs LOA for form filled out and restriction paper so that she can return to work.

## 2017-04-11 NOTE — Telephone Encounter (Signed)
Spoke with pt, she is going back to work on Thursday. She has a follow up appointment 05-10-17. At the time of her cath, dr Katrinka Blazingsmith mentioned that she would be started on a new medication at follow up. The patient wants to make sure the august appointment is soon enough or if something will be called in prior to that appointment. Will forward for dr Jens Somcrenshaw review

## 2017-04-11 NOTE — Telephone Encounter (Signed)
No need to move appt up Olga MillersBrian Sani Loiseau

## 2017-04-11 NOTE — Telephone Encounter (Signed)
Patient returning your call.  Thanks.

## 2017-04-11 NOTE — Telephone Encounter (Signed)
Spoke with pt, Aware of dr crenshaw's recommendations.  °

## 2017-04-13 ENCOUNTER — Telehealth: Payer: Self-pay | Admitting: Cardiology

## 2017-04-13 NOTE — Telephone Encounter (Signed)
Received FMLA Forms via Fax.  No Auth/Check to process.  [facility] [employer] for letter/pkt to be sent to patient. lp

## 2017-04-27 NOTE — Progress Notes (Signed)
HPI: FU mitral regurgitation. Echocardiogram March 2018 showed normal LV function, eccentric mitral regurgitation directed posteriorly and difficult to quantitate but appeared mild. TEE June 2018 showed normal LV systolic function, prolapse of the anterior mitral valve leaflet with severe mitral regurgitation and mild left atrial enlargement. Cardiac catheterization June 2018 showed an occluded right coronary artery which was collateralized from the left system and no obstructive disease otherwise. Moderate mitral regurgitation with normal pulmonary capillary wedge pressure and no V-wave. Ejection fraction normal. Since last seen, the patient has dyspnea with more extreme activities but not with routine activities. It is relieved with rest. It is not associated with chest pain. There is no orthopnea, PND; mild pedal edema. There is no syncope or palpitations. There is no exertional chest pain.   Current Outpatient Prescriptions  Medication Sig Dispense Refill  . amLODipine (NORVASC) 5 MG tablet Take 5 mg by mouth daily.    Marland Kitchen. aspirin 81 MG chewable tablet Chew 81 mg by mouth once.    Marland Kitchen. atorvastatin (LIPITOR) 40 MG tablet Take 1 tablet (40 mg total) by mouth daily. 90 tablet 3  . fluticasone (FLONASE) 50 MCG/ACT nasal spray Place 1 spray into both nostrils daily as needed for allergies or rhinitis.    Marland Kitchen. levocetirizine (XYZAL) 5 MG tablet Take 1 tablet by mouth daily as needed (allergies).     . metFORMIN (GLUCOPHAGE) 500 MG tablet Take 1,000 mg by mouth 2 (two) times daily with a meal.    . Multiple Vitamin (MULTIVITAMIN) tablet Take 1 tablet by mouth daily.     No current facility-administered medications for this visit.      Past Medical History:  Diagnosis Date  . Allergy   . Anxiety   . Chronic bronchitis (HCC)    per pt stopped when I quit smoking  . Depression   . Diabetes mellitus without complication (HCC)   . Heart murmur   . Hyperlipidemia   . Hypertension   . PCOS  (polycystic ovarian syndrome)   . Sleep apnea     Past Surgical History:  Procedure Laterality Date  . CHOLECYSTECTOMY    . RIGHT/LEFT HEART CATH AND CORONARY ANGIOGRAPHY Right 04/07/2017   Procedure: Right/Left Heart Cath and Coronary Angiography;  Surgeon: Lyn RecordsSmith, Henry W, MD;  [facility];  Service: Cardiovascular;  Laterality: Right;  . TEE WITHOUT CARDIOVERSION N/A 04/07/2017   Procedure: TRANSESOPHAGEAL ECHOCARDIOGRAM (TEE);  Surgeon: Lewayne Buntingrenshaw, Brian S, MD;  [facility];  Service: Cardiovascular;  Laterality: N/A;    Social History   Social History  . Marital status: Divorced    Spouse name: N/A  . Number of children: N/A  . Years of education: N/A   Occupational History  .      Retail   Social History Main Topics  . Smoking status: Former Smoker    Packs/day: 1.00    Years: 15.00    Types: Cigarettes    Quit date: 06/11/2015  . Smokeless tobacco: Never Used     Comment: 2nd hand smoke exposure current  . Alcohol use No  . Drug use: No  . Sexual activity: Not Currently   Other Topics Concern  . Not on file   Social History Narrative  . No narrative on file    Family History  Problem Relation Age of Onset  . Heart attack Mother 3255  . Hypertension Mother   . Heart attack Father 8144  . Diabetes Brother   . Aneurysm  Brother   . Hypertension Brother   . Stroke Brother   . Heart attack Brother 43  . Lung cancer Brother   . Diabetes Brother     ROS: no fevers or chills, productive cough, hemoptysis, dysphasia, odynophagia, melena, hematochezia, dysuria, hematuria, rash, seizure activity, orthopnea, PND, claudication. Remaining systems are negative.  Physical Exam: Well-developed obese in no acute distress.  Skin is warm and dry.  HEENT is normal.  Neck is supple.  Chest is clear to auscultation with normal expansion.  Cardiovascular exam is regular rate and rhythm.  Abdominal exam nontender or distended. No masses  palpated. Extremities show no edema. neuro grossly intact   A/P  1 Coronary artery disease-continue aspirin and statin.  2 mitral regurgitation-moderate by catheterization and severe by TEE. She will need follow-up echocardiograms in the future. Can consider referral for surgical intervention if she develops symptoms, left ventricular dysfunction or left ventricular enlargement.  3 hypertension-blood pressure elevated. Add Cozaar 50 mg daily. In 1 week check potassium and renal function.  4 hyperlipidemia-continue statin. Check lipids and liver.  5 obesity-we discussed the importance of weight loss.  Olga Millers, MD

## 2017-04-28 ENCOUNTER — Encounter: Payer: Self-pay | Admitting: Physician Assistant

## 2017-04-28 ENCOUNTER — Ambulatory Visit (INDEPENDENT_AMBULATORY_CARE_PROVIDER_SITE_OTHER): Payer: BLUE CROSS/BLUE SHIELD | Admitting: Physician Assistant

## 2017-04-28 VITALS — BP 132/76 | HR 86 | Ht 62.0 in | Wt 237.0 lb

## 2017-04-28 DIAGNOSIS — L84 Corns and callosities: Secondary | ICD-10-CM

## 2017-04-28 NOTE — Progress Notes (Signed)
HPI:                                                                Mary Clarke is a 46 y.o. female who presents to [name]: Primary Care [facility] Medicine today for corn on foot  Patient with PMH of DM II, HTN, HLD reports corn on plantar aspect of left foot. Has been present for approx 3 months. It has progressively become more tender to now it hurts to stand/bear weight.  Past Medical History:  Diagnosis Date  . Allergy   . Anxiety   . Chronic bronchitis (HCC)    per pt stopped when I quit smoking  . Depression   . Diabetes mellitus without complication (HCC)   . Heart murmur   . Hyperlipidemia   . Hypertension   . PCOS (polycystic ovarian syndrome)   . Sleep apnea    Past Surgical History:  Procedure Laterality Date  . CHOLECYSTECTOMY    . RIGHT/LEFT HEART CATH AND CORONARY ANGIOGRAPHY Right 04/07/2017   Procedure: Right/Left Heart Cath and Coronary Angiography;  Surgeon: Lyn Records, MD;  Location: [facility] INVASIVE CV LAB;  Service: Cardiovascular;  Laterality: Right;  . TEE WITHOUT CARDIOVERSION N/A 04/07/2017   Procedure: TRANSESOPHAGEAL ECHOCARDIOGRAM (TEE);  Surgeon: Lewayne Bunting, MD;  Location[facility] ENDOSCOPY;  Service: Cardiovascular;  Laterality: N/A;   Social History  Substance Use Topics  . Smoking status: Former Smoker    Packs/day: 1.00    Years: 15.00    Types: Cigarettes    Quit date: 06/11/2015  . Smokeless tobacco: Never Used     Comment: 2nd hand smoke exposure current  . Alcohol use No   family history includes Aneurysm in her brother; Diabetes in her brother and brother; Heart attack (age of onset: 42) in her brother; Heart attack (age of onset: 54) in her father; Heart attack (age of onset: 61) in her mother; Hypertension in her brother and mother; Lung cancer in her brother; Stroke in her brother.  ROS: negative except as noted in the HPI  Medications: Current Outpatient Prescriptions  Medication Sig Dispense Refill  .  amLODipine (NORVASC) 5 MG tablet Take 5 mg by mouth daily.    Marland Kitchen aspirin 81 MG chewable tablet Chew 81 mg by mouth once.    Marland Kitchen atorvastatin (LIPITOR) 40 MG tablet Take 1 tablet (40 mg total) by mouth daily. 90 tablet 3  . fluticasone (FLONASE) 50 MCG/ACT nasal spray Place 1 spray into both nostrils daily as needed for allergies or rhinitis.    Marland Kitchen levocetirizine (XYZAL) 5 MG tablet Take 1 tablet by mouth daily as needed (allergies).     . metFORMIN (GLUCOPHAGE) 500 MG tablet Take 1,000 mg by mouth 2 (two) times daily with a meal.    . Multiple Vitamin (MULTIVITAMIN) tablet Take 1 tablet by mouth daily.     No current facility-administered medications for this visit.    No Known Allergies     Objective:  BP 132/76   Pulse 86   Ht 5\' 2"  (1.575 m)   Wt 237 lb (107.5 kg)   BMI 43.35 kg/m  Gen: well-groomed, cooperative, not ill-appearing, no distress HEENT: normal conjunctiva, wearing glasses, trachea midline Pulm: Normal work of breathing, normal phonation Neuro: alert  and oriented x 3, EOM's intact, no tremor MSK: extremities atraumatic, normal gait and station, no peripheral edema, distal pulses intact Skin: warm, dry, intact; left forefoot with hyperkeratotic papule on lateral plantar aspect    No results found for this or any previous visit (from the past 72 hour(s)). No results found.    Assessment and Plan: 46 y.o. female with  1. Corn of foot - offloading with foam padding performed today - given patient is diabetic and greater risk for wounds/complications/delayed healing, will refer to podiatry for excision - Ambulatory referral to Podiatry   Patient education and anticipatory guidance given Patient agrees with treatment plan Follow-up as needed if symptoms worsen or fail to improve  Levonne Hubertharley E. Chalese Peach PA-C

## 2017-05-10 ENCOUNTER — Ambulatory Visit (INDEPENDENT_AMBULATORY_CARE_PROVIDER_SITE_OTHER): Payer: BLUE CROSS/BLUE SHIELD | Admitting: Cardiology

## 2017-05-10 ENCOUNTER — Encounter: Payer: Self-pay | Admitting: Cardiology

## 2017-05-10 VITALS — BP 152/91 | HR 87 | Ht 62.0 in | Wt 237.0 lb

## 2017-05-10 DIAGNOSIS — I340 Nonrheumatic mitral (valve) insufficiency: Secondary | ICD-10-CM

## 2017-05-10 DIAGNOSIS — I2510 Atherosclerotic heart disease of native coronary artery without angina pectoris: Secondary | ICD-10-CM | POA: Diagnosis not present

## 2017-05-10 DIAGNOSIS — I10 Essential (primary) hypertension: Secondary | ICD-10-CM

## 2017-05-10 DIAGNOSIS — E7800 Pure hypercholesterolemia, unspecified: Secondary | ICD-10-CM

## 2017-05-10 MED ORDER — LOSARTAN POTASSIUM 50 MG PO TABS: 50.0000 mg | ORAL_TABLET | Freq: Every day | ORAL | 3 refills | Status: DC

## 2017-05-10 NOTE — Patient Instructions (Signed)
Medication Instructions:   START LOSARTAN 50 MG ONCE DAILY  Labwork:  Your physician recommends that you return for lab work in: ONE WEEK=FASTING  Follow-Up:  Your physician wants you to follow-up in: 6 MONTHS WITH DR Jens SomRENSHAW You will receive a reminder letter in the mail two months in advance. If you don't receive a letter, please call our office to schedule the follow-up appointment.   If you need a refill on your cardiac medications before your next appointment, please call your pharmacy.

## 2017-05-12 ENCOUNTER — Telehealth: Payer: Self-pay

## 2017-05-12 NOTE — Telephone Encounter (Signed)
Misty StanleyLisa called and left a message stating she would like a call back. I called and left a message advising a return call.

## 2017-05-17 DIAGNOSIS — E7800 Pure hypercholesterolemia, unspecified: Secondary | ICD-10-CM | POA: Diagnosis not present

## 2017-05-18 ENCOUNTER — Telehealth: Payer: Self-pay | Admitting: *Deleted

## 2017-05-18 LAB — BASIC METABOLIC PANEL
BUN: 13 mg/dL (ref 7–25)
CHLORIDE: 103 mmol/L (ref 98–110)
CO2: 25 mmol/L (ref 20–32)
Calcium: 9.6 mg/dL (ref 8.6–10.2)
Creat: 0.64 mg/dL (ref 0.50–1.10)
Glucose, Bld: 123 mg/dL — ABNORMAL HIGH (ref 65–99)
Potassium: 5.2 mmol/L (ref 3.5–5.3)
SODIUM: 143 mmol/L (ref 135–146)

## 2017-05-18 LAB — HEPATIC FUNCTION PANEL
ALBUMIN: 3.9 g/dL (ref 3.6–5.1)
ALT: 18 U/L (ref 6–29)
AST: 16 U/L (ref 10–35)
Alkaline Phosphatase: 87 U/L (ref 33–115)
BILIRUBIN TOTAL: 0.5 mg/dL (ref 0.2–1.2)
Bilirubin, Direct: 0.1 mg/dL (ref <=0.2)
Indirect Bilirubin: 0.4 mg/dL (ref 0.2–1.2)
Total Protein: 7 g/dL (ref 6.1–8.1)

## 2017-05-18 LAB — LIPID PANEL
Cholesterol: 186 mg/dL (ref <200)
HDL: 27 mg/dL — AB (ref >50)
Total CHOL/HDL Ratio: 6.9 Ratio — ABNORMAL HIGH (ref <5.0)
Triglycerides: 664 mg/dL — ABNORMAL HIGH (ref <150)

## 2017-05-18 NOTE — Telephone Encounter (Signed)
Mrs. Mary Clarke is returning your call about results.. Thanks

## 2017-05-18 NOTE — Telephone Encounter (Signed)
pt aware of results, she has transportation issues and is not able to get to [place] easily. Per the patient request, left a message for the PCP if they know of anywhere she could go in [place] regarding her cholesterol. Lab results forwarded to PCP. If not able to do aomething in [place] she will try to find a way to get [place]. Will wait for PCP to call back.

## 2017-05-18 NOTE — Telephone Encounter (Addendum)
-----   Message from Lewayne BuntingBrian S Crenshaw, MD sent at 05/18/2017  7:15 AM EDT ----- Refer to lipid clinic Olga MillersBrian Crenshaw   Left message for pt to call to discuss recent labs

## 2017-05-22 ENCOUNTER — Encounter: Payer: Self-pay | Admitting: Podiatry

## 2017-05-22 ENCOUNTER — Ambulatory Visit (INDEPENDENT_AMBULATORY_CARE_PROVIDER_SITE_OTHER): Payer: BLUE CROSS/BLUE SHIELD | Admitting: Podiatry

## 2017-05-22 VITALS — BP 141/73 | HR 78 | Ht 62.5 in | Wt 263.0 lb

## 2017-05-22 DIAGNOSIS — B351 Tinea unguium: Secondary | ICD-10-CM | POA: Diagnosis not present

## 2017-05-22 DIAGNOSIS — E119 Type 2 diabetes mellitus without complications: Secondary | ICD-10-CM | POA: Diagnosis not present

## 2017-05-22 DIAGNOSIS — L84 Corns and callosities: Secondary | ICD-10-CM

## 2017-05-22 DIAGNOSIS — M79671 Pain in right foot: Secondary | ICD-10-CM

## 2017-05-22 DIAGNOSIS — M79672 Pain in left foot: Secondary | ICD-10-CM | POA: Diagnosis not present

## 2017-05-22 NOTE — Patient Instructions (Signed)
Seen for pain under the left foot. Noted of severe porokeratotic lesion mixed with eschar. Reviewed findings and available treatment options. Painful lesion and all nails debrided. Return in 1 month.

## 2017-05-22 NOTE — Progress Notes (Signed)
SUBJECTIVE: 46 y.o. year old female presents complaining of pain under the ball of left from thick callused skin possible duration of 2-3 months. The first time she had this problem was 4 years ago.  On feet 4-10 hours a day. Stated that her blood sugar reading was 122.  REVIEW OF SYSTEMS: A comprehensive review of systems was negative except for: DM, Poly cystic ovarian syndrome.   OBJECTIVE: DERMATOLOGIC EXAMINATION: Nails:thick dystrophic nails x 10. Protruding tyloma under 5th MPJ left foot with pain upon ambulation.   VASCULAR EXAMINATION OF LOWER LIMBS: All pedal pulses are palpable with normal pulsation.  Capillary Filling times within 3 seconds in all digits.  No edema or erythema noted. Temperature gradient from tibial crest to dorsum of foot is within normal bilateral.  NEUROLOGIC EXAMINATION OF THE LOWER LIMBS: Achilles DTR is present and within normal. Monofilament (Semmes-Weinstein 10-gm) sensory testing positive 6 out of 6, bilateral. Vibratory sensations(128Hz  turning fork) intact at medial and lateral forefoot bilateral.  Sharp and Dull discriminatory sensations at the plantar ball of hallux is intact bilateral.   MUSCULOSKELETAL EXAMINATION: High arched foot without gross deformities.  ASSESSMENT: Painful tyloma with eschar under 5th MPJ left foot. Onychomycosis bilateral. Pain left foot with ambulation.  PLAN: Reviewed clinical findings and available treatment options. All lesions and nails debrided. Return in one month.

## 2017-05-23 ENCOUNTER — Telehealth: Payer: Self-pay | Admitting: Cardiology

## 2017-05-23 NOTE — Telephone Encounter (Signed)
New message  Pt c/o medication issue:  1. Name of Medication: losartan  2. How are you currently taking this medication (dosage and times per day)? 50mg    3. Are you having a reaction (difficulty breathing--STAT)? Dizziness   4. What is your medication issue? Per pt would like to speak with Rn about medication causing dizzy spells. Pt states this has been going on since it was prescribed to here. She states it was coming and going. Pt says last night she got dizzy and is still dizzy today. Please call back to discuss

## 2017-05-23 NOTE — Telephone Encounter (Signed)
Spoke with pt she states that since she started taking 05-11-17 she has had intermittent dizziness that has been lasting only a few seconds but since last night dizziness lasted for greater than an hour then started again this morning ~630am until now but has been decreasing, she states that it is making it hard for her to walk. She denies any other sx. She states-her BP and blood sugar are "fine". Please advise

## 2017-05-24 ENCOUNTER — Telehealth: Payer: Self-pay

## 2017-05-24 NOTE — Telephone Encounter (Signed)
Left message for assistant to PCP to return my call

## 2017-05-24 NOTE — Telephone Encounter (Signed)
Check BP when she feels dizzy and give fu readings Mary MillersBrian Shweta Clarke

## 2017-05-24 NOTE — Telephone Encounter (Signed)
Lmvm-detailed message (DPR)

## 2017-05-24 NOTE — Telephone Encounter (Signed)
Mary Clarke from Dr. Ludwig Clarksrenshaw's called asking if we knew of a lipid clinic here in [place] to refer Misty StanleyLisa to. Please advise. -EH/RMA

## 2017-05-25 NOTE — Telephone Encounter (Signed)
I do not know of one. I asked other providers and they do not know either

## 2017-05-26 NOTE — Telephone Encounter (Signed)
Message sent to scheduling to schedule lipid clinic appt.  [id number] to schedule appt

## 2017-05-26 NOTE — Telephone Encounter (Signed)
Follow up    Pt PCP office called back they do not know of any LIPID clinics in [place], the only ones for [facility] they know of is [place]. sorry

## 2017-05-26 NOTE — Telephone Encounter (Signed)
I called and advised Dr Ludwig Clarks office.

## 2017-05-28 ENCOUNTER — Other Ambulatory Visit: Payer: Self-pay | Admitting: Physician Assistant

## 2017-05-28 DIAGNOSIS — E119 Type 2 diabetes mellitus without complications: Secondary | ICD-10-CM

## 2017-05-28 DIAGNOSIS — E282 Polycystic ovarian syndrome: Secondary | ICD-10-CM

## 2017-05-29 ENCOUNTER — Other Ambulatory Visit: Payer: Self-pay | Admitting: Physician Assistant

## 2017-05-29 ENCOUNTER — Encounter: Payer: Self-pay | Admitting: Physician Assistant

## 2017-05-29 DIAGNOSIS — I10 Essential (primary) hypertension: Secondary | ICD-10-CM

## 2017-05-29 NOTE — Telephone Encounter (Signed)
Spoke with pt, she is not able to get to an appointment due to transportation. She will see about transportation and let me know.

## 2017-05-30 ENCOUNTER — Telehealth: Payer: Self-pay | Admitting: Cardiology

## 2017-05-30 DIAGNOSIS — H6123 Impacted cerumen, bilateral: Secondary | ICD-10-CM | POA: Diagnosis not present

## 2017-05-30 DIAGNOSIS — H903 Sensorineural hearing loss, bilateral: Secondary | ICD-10-CM | POA: Diagnosis not present

## 2017-05-30 DIAGNOSIS — H9313 Tinnitus, bilateral: Secondary | ICD-10-CM | POA: Diagnosis not present

## 2017-05-30 NOTE — Telephone Encounter (Signed)
Closed Encounter  °

## 2017-06-05 ENCOUNTER — Ambulatory Visit: Payer: BLUE CROSS/BLUE SHIELD

## 2017-06-19 ENCOUNTER — Ambulatory Visit: Payer: BLUE CROSS/BLUE SHIELD | Admitting: Podiatry

## 2017-06-23 ENCOUNTER — Ambulatory Visit (INDEPENDENT_AMBULATORY_CARE_PROVIDER_SITE_OTHER): Payer: BLUE CROSS/BLUE SHIELD | Admitting: Pharmacist Clinician (PhC)/ Clinical Pharmacy Specialist

## 2017-06-23 DIAGNOSIS — E1169 Type 2 diabetes mellitus with other specified complication: Secondary | ICD-10-CM | POA: Diagnosis not present

## 2017-06-23 DIAGNOSIS — E785 Hyperlipidemia, unspecified: Secondary | ICD-10-CM | POA: Diagnosis not present

## 2017-06-23 MED ORDER — FENOFIBRATE 145 MG PO TABS: 145.0000 mg | ORAL_TABLET | Freq: Every day | ORAL | 5 refills | Status: DC

## 2017-06-23 NOTE — Assessment & Plan Note (Signed)
Patient with likely familial hyperlipidemia, current LDL not calculated due to triglycerides of 664.  Patient willing to work more with her dietician, but not sounding agreeable to dietary changes.  Encouraged her to follow up with that office.  Also recommended she follow up with PCP to get current A1c and tighter control of DM, if possible.  Guidelines suggest that [place] Pepper is a good add on to metformin in patiens with DM and CAD to reduce major adverse cardiovascular events.  Will re-start her on fenofibrate 145 mg daily with a meal to get triglycerides down.  Will have her repeat labs in 3 months and see her back after that to review improvements and next step.  She would be a good candidate for PCSK-9 inhibitor, but is not interested in injections.  I think once we get her triglycerides down and see what her LDL is we can get her to try the monthly Repatha Pushtronix.

## 2017-06-23 NOTE — Patient Instructions (Signed)
Start fenofibrate 145 mg once daily with a meal.  Continue working with your dietician and try to walk on days that you don't work.   Talk to your family doctor about getting tighter control on your diabetes.  She might consider adding Jardiance, as there is some benefit for cardiac patients.  We will mail lab orders to you in 2 months to repeat labs in 3 months (early December)   Cholesterol Cholesterol is a fat. Your body needs a small amount of cholesterol. Cholesterol (plaque) may build up in your blood vessels (arteries). That makes you more likely to have a heart attack or stroke. You cannot feel your cholesterol level. Having a blood test is the only way to find out if your level is high. Keep your test results. Work with your doctor to keep your cholesterol at a good level. What do the results mean?  Total cholesterol is how much cholesterol is in your blood.  LDL is bad cholesterol. This is the type that can build up. Try to have low LDL.  HDL is good cholesterol. It cleans your blood vessels and carries LDL away. Try to have high HDL.  Triglycerides are fat that the body can store or burn for energy. What are good levels of cholesterol?  Total cholesterol below 200.  LDL below 100 is good for people who have health risks. LDL below 70 is good for people who have very high risks.  HDL above 40 is good. It is best to have HDL of 60 or higher.  Triglycerides below 150. How can I lower my cholesterol? Diet Follow your diet program as told by your doctor.  Choose fish, white meat chicken, or [place] that is roasted or baked. Try not to eat red meat, fried foods, sausage, or lunch meats.  Eat lots of fresh fruits and vegetables.  Choose whole grains, beans, pasta, potatoes, and cereals.  Choose olive oil, corn oil, or canola oil. Only use small amounts.  Try not to eat butter, mayonnaise, shortening, or palm kernel oils.  Try not to eat foods with trans fats.  Choose  low-fat or nonfat dairy foods. ? Drink skim or nonfat milk. ? Eat low-fat or nonfat yogurt and cheeses. ? Try not to drink whole milk or cream. ? Try not to eat ice cream, egg yolks, or full-fat cheeses.  Healthy desserts include angel food cake, ginger snaps, animal crackers, hard candy, popsicles, and low-fat or nonfat frozen yogurt. Try not to eat pastries, cakes, pies, and cookies.  Exercise Follow your exercise program as told by your doctor.  Be more active. Try gardening, walking, and taking the stairs.  Ask your doctor about ways that you can be more active.  Medicine  Take over-the-counter and prescription medicines only as told by your doctor.  This information is not intended to replace advice given to you by your health care provider. Make sure you discuss any questions you have with your health care provider. Document Released: 12/23/2008 Document Revised: 04/27/2016 Document Reviewed: 04/07/2016 Elsevier Interactive Patient Education  2017 [employer].

## 2017-06-23 NOTE — Progress Notes (Signed)
06/23/2017 Mary Clarke 06/15/1971 [id number]   HPI:  Mary Clarke is a 46 y.o. female patient of Dr Jens Som, who presents today for a lipid clinic evaluation.   Her medical history is significant for CAD with occluded RCA, hypertension, mixed hyperlipidemia (probably familial), and type 2 diabetes (diagnosed at age 19).  Her diabetes is followed by Dr. Gena Fray, but patient admits she has not been seen in awhile, she believes her last A1c was around 8.  She has also been seen by a dietician, but apparently goes only when she has questions  Current Medications:  Atorvastatin 40 mg qd - no issues  Cholesterol Goals:   TG < 150, LDL < 70  Intolerant/previously tried:  Recalls being on fenofibrate in the past, just quit taking it at some point  Family history:   Father died at age 48, had 4 MI in 3 days  Mother - stents, angioplasty, CABG x 3 in late 26's, died at 8 from MI  1 brother - CABG x 4 at age 81 died last year at 49 from cardiac complications  1 brother died last year of cancer at 72  2 sisters heathy 1 with hypertension/hyperlipidemia  Diet:   More home cooking than out; no aded salt; lot sof chicken, porkchops, ground [place]; recently switched to brown rice, whole wheat pasta, trying to limit to once weekly;  Doesn't like the idea of "dieting", thinks unsweetened cereals are "gross", doesn't like wheat/whole grain breads  Exercise:    No regular exercise on days off; works at [employer] 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 amLODipine (NORVASC) 5 MG tablet TAKE 1 TABLET (5 MG TOTAL) BY MOUTH DAILY. 30 tablet 3  . aspirin 81 MG chewable tablet Chew 81 mg by mouth once.    Marland Kitchen atorvastatin (LIPITOR) 40 MG tablet Take 1 tablet (40 mg total) by mouth daily. 90 tablet 3  .  fenofibrate (TRICOR) 145 MG tablet Take 1 tablet (145 mg total) by mouth daily. 30 tablet 5  . fluticasone (FLONASE) 50 MCG/ACT nasal spray Place 1 spray into both nostrils daily as needed for allergies or rhinitis.    Marland Kitchen levocetirizine (XYZAL) 5 MG tablet Take 1 tablet by mouth daily as needed (allergies).     . losartan (COZAAR) 50 MG tablet Take 1 tablet (50 mg total) by mouth daily. 90 tablet 3  . metFORMIN (GLUCOPHAGE) 1000 MG tablet Take 1 tablet (1,000 mg total) by mouth 2 (two) times daily with a meal. 180 tablet 0  . metFORMIN (GLUCOPHAGE) 500 MG tablet Take 1,000 mg by mouth 2 (two) times daily with a meal.    . Multiple Vitamin (MULTIVITAMIN) tablet Take 1 tablet by mouth daily.     No current facility-administered medications for this visit.     No Known Allergies  Past Medical History:  Diagnosis Date  . Allergy   . Anxiety   . Chronic bronchitis (HCC)    per pt stopped when I quit smoking  . Depression   . Diabetes mellitus without complication (HCC)   . Heart murmur   . Hyperlipidemia   . Hypertension   . PCOS (polycystic ovarian syndrome)   . Severe mitral regurgitation by prior echocardiogram 01/04/2017  . Sleep apnea  There were no vitals taken for this visit.   Hyperlipidemia associated with type 2 diabetes mellitus (HCC) Patient with likely familial hyperlipidemia, current LDL not calculated due to triglycerides of 664.  Patient willing to work more with her dietician, but not sounding agreeable to dietary changes.  Encouraged her to follow up with that office.  Also recommended she follow up with PCP to get current A1c and tighter control of DM, if possible.  Guidelines suggest that London Pepper is a good add on to metformin in patiens with DM and CAD to reduce major adverse cardiovascular events.  Will re-start her on fenofibrate 145 mg daily with a meal to get triglycerides down.  Will have her repeat labs in 3 months and see her back after that to review  improvements and next step.  She would be a good candidate for PCSK-9 inhibitor, but is not interested in injections.  I think once we get her triglycerides down and see what her LDL is we can get her to try the monthly Repatha Pushtronix.     Phillips Hay PharmD CPP [facility]

## 2017-07-27 LAB — HM DIABETES EYE EXAM

## 2017-08-03 ENCOUNTER — Ambulatory Visit (INDEPENDENT_AMBULATORY_CARE_PROVIDER_SITE_OTHER): Payer: BLUE CROSS/BLUE SHIELD | Admitting: Physician Assistant

## 2017-08-03 ENCOUNTER — Encounter: Payer: Self-pay | Admitting: Physician Assistant

## 2017-08-03 VITALS — BP 110/73 | HR 77 | Temp 98.8°F | Wt 231.0 lb

## 2017-08-03 DIAGNOSIS — E785 Hyperlipidemia, unspecified: Secondary | ICD-10-CM

## 2017-08-03 DIAGNOSIS — E1169 Type 2 diabetes mellitus with other specified complication: Secondary | ICD-10-CM | POA: Diagnosis not present

## 2017-08-03 DIAGNOSIS — I340 Nonrheumatic mitral (valve) insufficiency: Secondary | ICD-10-CM

## 2017-08-03 DIAGNOSIS — E781 Pure hyperglyceridemia: Secondary | ICD-10-CM

## 2017-08-03 DIAGNOSIS — E119 Type 2 diabetes mellitus without complications: Secondary | ICD-10-CM

## 2017-08-03 DIAGNOSIS — R232 Flushing: Secondary | ICD-10-CM | POA: Insufficient documentation

## 2017-08-03 NOTE — Progress Notes (Signed)
HPI:                                                                Mary MajorLisa Clarke is a 46 y.o. female who [facility] today for facial flushing  Patient reports facial flushing, redness, and warmth 2-3 times daily. Sometimes associated with lightheadedness, headache and nausea. This began approximately 4 weeks ago. Episodes last 15 minutes - 2 hours. Not associated with activities. She has been checking her blood sugars and blood pressure during the episodes and reports they are "fine." Newest medication is Fenofibrate, which she began 06/23/17. Otherwise, there have been no changes.  DMII: taking Metformin 1000 bid. Compliant with medications. Last A1C 8.0, 8 months ago. Checks blood sugars at home. Blood sugar range 100-130. Denies polyuria, vision change, and paresthesias. Denies hypoglycemic events. Denies ulcers/wounds on feet. [facility] Foot exam: UTD Diet: saw nutritionist Exercise: none   Past Medical History:  Diagnosis Date  . Allergy   . Anxiety   . Chronic bronchitis (HCC)    per pt stopped when I quit smoking  . Depression   . Diabetes mellitus without complication (HCC)   . Heart murmur   . Hyperlipidemia   . Hypertension   . PCOS (polycystic ovarian syndrome)   . Severe mitral regurgitation by prior echocardiogram 01/04/2017  . Sleep apnea    Past Surgical History:  Procedure Laterality Date  . CHOLECYSTECTOMY    . RIGHT/LEFT HEART CATH AND CORONARY ANGIOGRAPHY Right 04/07/2017   Procedure: Right/Left Heart Cath and Coronary Angiography;  Surgeon: Lyn RecordsSmith, Henry W, MD;  [facility];  Service: Cardiovascular;  Laterality: Right;  . TEE WITHOUT CARDIOVERSION N/A 04/07/2017   Procedure: TRANSESOPHAGEAL ECHOCARDIOGRAM (TEE);  Surgeon: Lewayne Buntingrenshaw, Brian S, MD;  [facility];  Service: Cardiovascular;  Laterality: N/A;   Social History  Substance Use Topics  .  Smoking status: Former Smoker    Packs/day: 1.00    Years: 15.00    Types: Cigarettes    Quit date: 06/11/2015  . Smokeless tobacco: Never Used     Comment: 2nd hand smoke exposure current  . Alcohol use No   family history includes Aneurysm in her brother; Diabetes in her brother and brother; Heart attack (age of onset: 4843) in her brother; Heart attack (age of onset: 8444) in her father; Heart attack (age of onset: 6555) in her mother; Hypertension in her brother and mother; Lung cancer in her brother; Stroke in her brother.  ROS: negative except as noted in the HPI  Medications: Current Outpatient Prescriptions  Medication Sig Dispense Refill  . amLODipine (NORVASC) 5 MG tablet TAKE 1 TABLET (5 MG TOTAL) BY MOUTH DAILY. 30 tablet 3  . aspirin 81 MG chewable tablet Chew 81 mg by mouth once.    Marland Kitchen. atorvastatin (LIPITOR) 40 MG tablet Take 1 tablet (40 mg total) by mouth daily. 90 tablet 3  . fenofibrate (TRICOR) 145 MG tablet Take 1 tablet (145 mg total) by mouth daily. 30 tablet 5  . fluticasone (FLONASE) 50 MCG/ACT nasal spray Place 1 spray into both nostrils daily as needed for allergies or rhinitis.    Marland Kitchen. levocetirizine (XYZAL) 5 MG tablet Take 1 tablet by mouth  daily as needed (allergies).     . losartan (COZAAR) 50 MG tablet Take 1 tablet (50 mg total) by mouth daily. 90 tablet 3  . metFORMIN (GLUCOPHAGE) 1000 MG tablet Take 1 tablet (1,000 mg total) by mouth 2 (two) times daily with a meal. 180 tablet 0  . Multiple Vitamin (MULTIVITAMIN) tablet Take 1 tablet by mouth daily.     No current facility-administered medications for this visit.    No Known Allergies     Objective:  BP 110/73   Pulse 77   Temp 98.8 F (37.1 C) (Oral)   Wt 231 lb (104.8 kg)   SpO2 96%   BMI 41.58 kg/m  Gen:  alert, not ill-appearing, no distress, appropriate for age, morbidly obese female HEENT: head normocephalic without obvious abnormality, conjunctiva and cornea clear, oropharynx clear, neck  supple, trachea midline Pulm: Normal work of breathing, normal phonation, clear to auscultation bilaterally, no wheezes, rales or rhonchi CV: Normal rate, regular rhythm, s1 and s2 distinct, grade II/VI systolic murmur, no clicks or rubs  Neuro: alert and oriented x 3, no tremor MSK: extremities atraumatic, normal gait and station Skin: intact, no rashes on exposed skin, no jaundice, no cyanosis Psych: well-groomed, cooperative, good eye contact, euthymic mood, affect mood-congruent, speech is articulate, and thought processes clear and goal-directed  Depression screen [facility] 2/9 08/03/2017 11/17/2016  Decreased Interest 1 1  Down, Depressed, Hopeless 2 2  PHQ - 2 Score 3 3  Altered sleeping 2 3  Tired, decreased energy 3 3  Change in appetite 1 0  Feeling bad or failure about yourself  0 0  Trouble concentrating 1 1  Moving slowly or fidgety/restless 0 0  Suicidal thoughts 0 0  PHQ-9 Score 10 10     No results found for this or any previous visit (from the past 72 hour(s)). No results found.    Assessment and Plan: 46 y.o. female with   1. Type 2 diabetes mellitus without complication, without long-term current use of insulin (HCC) - continue Metformin. Plan to check A1c today and if >7.0 will add Jardiance - cont ARB - cont statin - eye exam UTD, requesting records - Hemoglobin A1c - Comprehensive metabolic panel  2. Hyperlipidemia associated with type 2 diabetes mellitus (HCC) Lab Results  Component Value Date   CHOL 186 05/17/2017   HDL 27 (L) 05/17/2017   LDLCALC NOT CALC 05/17/2017   TRIG 664 (H) 05/17/2017   CHOLHDL 6.9 (H) 05/17/2017  - cont Atorvastatin   3. Hypertriglyceridemia - we will hold fenofibrate for 1 week to see if facial flushing resolves  4. Facial flushing - unclear etiology. Could be related to perimenopause or medication reaction - 5 HIAA w/Creatinine, 24 hr. To r/o carcinoid syndrome, though this is unlikely given heart cath did not show any  vegetations - check multivitamin for niacin - hold fenofibrate x 1 week  5. Moderate mitral regurgitation by prior echocardiogram - 5 HIAA w/Creatinine, 24 hr. - continue aggressive therapeutic lifestyle changes   Patient education and anticipatory guidance given Patient agrees with treatment plan Follow-up in 1 week or sooner as needed if symptoms worsen or fail to improve  Levonne Hubert PA-C

## 2017-08-03 NOTE — Patient Instructions (Addendum)
-   let's hold your fenofibrate for now for the next week - check your multivitamin for niacin. If it has niacin I would like you to hold it as well

## 2017-08-04 LAB — COMPREHENSIVE METABOLIC PANEL
AG Ratio: 1.2 (calc) (ref 1.0–2.5)
ALKALINE PHOSPHATASE (APISO): 47 U/L (ref 33–115)
ALT: 28 U/L (ref 6–29)
AST: 24 U/L (ref 10–35)
Albumin: 4 g/dL (ref 3.6–5.1)
BUN: 23 mg/dL (ref 7–25)
CO2: 26 mmol/L (ref 20–32)
CREATININE: 0.7 mg/dL (ref 0.50–1.10)
Calcium: 9.9 mg/dL (ref 8.6–10.2)
Chloride: 103 mmol/L (ref 98–110)
Globulin: 3.4 g/dL (calc) (ref 1.9–3.7)
Glucose, Bld: 99 mg/dL (ref 65–99)
Potassium: 4.1 mmol/L (ref 3.5–5.3)
Sodium: 139 mmol/L (ref 135–146)
Total Bilirubin: 0.4 mg/dL (ref 0.2–1.2)
Total Protein: 7.4 g/dL (ref 6.1–8.1)

## 2017-08-04 LAB — HEMOGLOBIN A1C
EAG (MMOL/L): 7 (calc)
Hgb A1c MFr Bld: 6 % of total Hgb — ABNORMAL HIGH (ref <5.7)
MEAN PLASMA GLUCOSE: 126 (calc)

## 2017-08-07 DIAGNOSIS — R232 Flushing: Secondary | ICD-10-CM | POA: Diagnosis not present

## 2017-08-07 DIAGNOSIS — E119 Type 2 diabetes mellitus without complications: Secondary | ICD-10-CM | POA: Diagnosis not present

## 2017-08-07 DIAGNOSIS — I340 Nonrheumatic mitral (valve) insufficiency: Secondary | ICD-10-CM | POA: Diagnosis not present

## 2017-08-09 NOTE — Progress Notes (Signed)
A1C is great (6.0). Continue Metformin as you are taking it. Continue dietary changes and reducing calories to lose weight. Metabolic panel is normal

## 2017-08-10 ENCOUNTER — Ambulatory Visit (INDEPENDENT_AMBULATORY_CARE_PROVIDER_SITE_OTHER): Payer: BLUE CROSS/BLUE SHIELD | Admitting: Physician Assistant

## 2017-08-10 ENCOUNTER — Encounter: Payer: Self-pay | Admitting: Physician Assistant

## 2017-08-10 VITALS — BP 127/83 | HR 77 | Temp 98.4°F | Wt 231.0 lb

## 2017-08-10 DIAGNOSIS — E282 Polycystic ovarian syndrome: Secondary | ICD-10-CM | POA: Diagnosis not present

## 2017-08-10 DIAGNOSIS — I10 Essential (primary) hypertension: Secondary | ICD-10-CM

## 2017-08-10 DIAGNOSIS — R232 Flushing: Secondary | ICD-10-CM | POA: Diagnosis not present

## 2017-08-10 DIAGNOSIS — Z1329 Encounter for screening for other suspected endocrine disorder: Secondary | ICD-10-CM

## 2017-08-10 DIAGNOSIS — N912 Amenorrhea, unspecified: Secondary | ICD-10-CM | POA: Diagnosis not present

## 2017-08-10 DIAGNOSIS — E349 Endocrine disorder, unspecified: Secondary | ICD-10-CM

## 2017-08-10 MED ORDER — LOSARTAN POTASSIUM 50 MG PO TABS: 100.0000 mg | ORAL_TABLET | Freq: Every day | ORAL | 3 refills | Status: DC

## 2017-08-10 MED ORDER — FENOFIBRATE 145 MG PO TABS: 145.0000 mg | ORAL_TABLET | Freq: Every day | ORAL | 5 refills | Status: DC

## 2017-08-10 NOTE — Progress Notes (Signed)
HPI:                                                                Mary Clarke is a 46 y.o. female who presents to [name] today for follow-up facial flushing  Patient continues to endorse daily episodes of bilateral facial flushing accompanied by nausea and sometimes "pounding headache." Episodes occur at rest or with minimal activity, such as standing at work. States most recent episode lasted 4 hours. States episodes are not debilitating. She reports face becomes red and warm, but denies redness or diaphoresis elsewhere. She checked her BP during most recent episode and it was slightly elevated at 145/86. Diabetes is well controlled (A1c 6.0). She reports she has not had a menstrual period in over 1 year, periods have always been irregular due to PCOS.   She has been holding her fenofibrate, but noted no change since doing so. She is not taking any OTC NSAIDs, except for baby aspirin. Denies fevers and malaise. Denies anxiety or feeling of impending doom.   Past Medical History:  Diagnosis Date  . Allergy   . Anxiety   . Chronic bronchitis (HCC)    per pt stopped when I quit smoking  . Depression   . Diabetes mellitus without complication (HCC)   . Heart murmur   . Hyperlipidemia   . Hypertension   . PCOS (polycystic ovarian syndrome)   . Severe mitral regurgitation by prior echocardiogram 01/04/2017  . Sleep apnea    Past Surgical History:  Procedure Laterality Date  . CHOLECYSTECTOMY    . RIGHT/LEFT HEART CATH AND CORONARY ANGIOGRAPHY Right 04/07/2017   Procedure: Right/Left Heart Cath and Coronary Angiography;  Surgeon: Lyn Records, MD;  Location: [facility] INVASIVE CV LAB;  Service: Cardiovascular;  Laterality: Right;  . TEE WITHOUT CARDIOVERSION N/A 04/07/2017   Procedure: TRANSESOPHAGEAL ECHOCARDIOGRAM (TEE);  Surgeon: Lewayne Bunting, MD;  Location: [facility] ENDOSCOPY;  Service: Cardiovascular;  Laterality: N/A;   Social History   Substance Use Topics  . Smoking status: Former Smoker    Packs/day: 1.00    Years: 15.00    Types: Cigarettes    Quit date: 06/11/2015  . Smokeless tobacco: Never Used     Comment: 2nd hand smoke exposure current  . Alcohol use No   family history includes Aneurysm in her brother; Diabetes in her brother and brother; Heart attack (age of onset: 55) in her brother; Heart attack (age of onset: 71) in her father; Heart attack (age of onset: 15) in her mother; Hypertension in her brother and mother; Lung cancer in her brother; Stroke in her brother.  ROS: negative except as noted in the HPI  Medications: Current Outpatient Prescriptions  Medication Sig Dispense Refill  . amLODipine (NORVASC) 5 MG tablet TAKE 1 TABLET (5 MG TOTAL) BY MOUTH DAILY. 30 tablet 3  . aspirin 81 MG chewable tablet Chew 81 mg by mouth once.    Marland Kitchen atorvastatin (LIPITOR) 40 MG tablet Take 1 tablet (40 mg total) by mouth daily. 90 tablet 3  . fluticasone (FLONASE) 50 MCG/ACT nasal spray Place 1 spray into both nostrils daily as needed for allergies or rhinitis.    Marland Kitchen levocetirizine (XYZAL) 5 MG tablet Take 1 tablet by mouth  daily as needed (allergies).     . metFORMIN (GLUCOPHAGE) 1000 MG tablet Take 1 tablet (1,000 mg total) by mouth 2 (two) times daily with a meal. 180 tablet 0  . Multiple Vitamin (MULTIVITAMIN) tablet Take 1 tablet by mouth daily.    Marland Kitchen. losartan (COZAAR) 50 MG tablet Take 1 tablet (50 mg total) by mouth daily. 90 tablet 3   No current facility-administered medications for this visit.    No Known Allergies     Objective:  BP 127/83   Pulse 77   Wt 231 lb (104.8 kg)   BMI 41.58 kg/m  Gen:  alert, not ill-appearing, no distress, appropriate for age, morbidly obese female HEENT: head normocephalic without obvious abnormality, conjunctiva and cornea clear, wearing glasses, trachea midline Pulm: Normal work of breathing, normal phonation Neuro: alert and oriented x 3, no tremor MSK: extremities  atraumatic, normal gait and station Skin: intact, no rashes on exposed skin, no jaundice, no cyanosis    No results found for this or any previous visit (from the past 72 hour(s)). No results found.    Assessment and Plan: 46 y.o. female with   1. Facial flushing - episodic, nothing present on physical exam. Will work-up for thyroid disease and menopause. We are also going to trial holding Amlodipine. Re-starting Fenofibrate - FSH - Luteinizing hormone - Prolactin - TSH - T3, free - T4, free - Calcitonin  2. PCOS (polycystic ovarian syndrome) - FSH - Luteinizing hormone - Prolactin  3. Amenorrhea - FSH - Luteinizing hormone - Prolactin  4. Screening for thyroid disorder - TSH - T3, free - T4, free - Calcitonin  5. Hypertension BP Readings from Last 3 Encounters:  08/10/17 127/83  08/03/17 110/73  05/22/17 (!) 141/73  - hold calcium channel blocker, increase Losartan to 100 mg daily   Patient education and anticipatory guidance given Patient agrees with treatment plan Follow-up in 4 weeks or sooner as needed if symptoms worsen or fail to improve  Levonne Hubertharley E. Mohid Furuya PA-C

## 2017-08-10 NOTE — Patient Instructions (Addendum)
-   Increase Losartan to tabs daily (100mg ) - Hold your Amlodipine - Restart your Fenofibrate - Monitor your BP to goal <130/80

## 2017-08-11 ENCOUNTER — Encounter: Payer: Self-pay | Admitting: Physician Assistant

## 2017-08-14 ENCOUNTER — Encounter: Payer: Self-pay | Admitting: Physician Assistant

## 2017-08-15 ENCOUNTER — Encounter: Payer: Self-pay | Admitting: Physician Assistant

## 2017-08-15 ENCOUNTER — Other Ambulatory Visit: Payer: Self-pay

## 2017-08-15 DIAGNOSIS — E349 Endocrine disorder, unspecified: Secondary | ICD-10-CM

## 2017-08-15 HISTORY — DX: Endocrine disorder, unspecified: E34.9

## 2017-08-15 LAB — CALCITONIN: Calcitonin: 21 pg/mL — ABNORMAL HIGH (ref <=5)

## 2017-08-15 LAB — FOLLICLE STIMULATING HORMONE: FSH: 16.2 m[IU]/mL

## 2017-08-15 LAB — PROLACTIN: PROLACTIN: 5.4 ng/mL

## 2017-08-15 LAB — LUTEINIZING HORMONE: LH: 10.1 m[IU]/mL

## 2017-08-15 LAB — T4, FREE: Free T4: 1.2 ng/dL (ref 0.8–1.8)

## 2017-08-15 LAB — T3, FREE: T3, Free: 2.7 pg/mL (ref 2.3–4.2)

## 2017-08-15 LAB — TSH: TSH: 1.56 mIU/L

## 2017-08-15 NOTE — Progress Notes (Signed)
Labs do not show menopause Thyroid function looks normal, but there was one abnormality called the calcitonin level. For this reason I am going to order a thyroid ultrasound to rule out thyroiditis or carcinoma I would also like her to have her calcitonin level rechecked this week

## 2017-08-15 NOTE — Addendum Note (Signed)
Addended by: Gena FrayUMMINGS, CHARLEY E on: 08/15/2017 03:22 PM   Modules accepted: Orders

## 2017-08-16 LAB — 5 HIAA W/CREATININE, 24 HR
Creatinine: 0.73 g/(24.h) (ref 0.50–2.15)
Volume, Urine-VMAUR: 1250 mL

## 2017-08-17 DIAGNOSIS — E349 Endocrine disorder, unspecified: Secondary | ICD-10-CM | POA: Diagnosis not present

## 2017-08-22 LAB — CALCITONIN: CALCITONIN: 23 pg/mL — AB (ref <=5)

## 2017-08-22 NOTE — Progress Notes (Signed)
Let Mary Clarke know that her calcitonin level is higher on re-check. I am referring her to [facility] for follow-up.

## 2017-08-24 ENCOUNTER — Ambulatory Visit (INDEPENDENT_AMBULATORY_CARE_PROVIDER_SITE_OTHER): Payer: BLUE CROSS/BLUE SHIELD

## 2017-08-24 DIAGNOSIS — E349 Endocrine disorder, unspecified: Secondary | ICD-10-CM | POA: Diagnosis not present

## 2017-08-24 DIAGNOSIS — R232 Flushing: Secondary | ICD-10-CM | POA: Diagnosis not present

## 2017-08-24 DIAGNOSIS — E070 Hypersecretion of calcitonin: Secondary | ICD-10-CM | POA: Diagnosis not present

## 2017-08-25 ENCOUNTER — Other Ambulatory Visit: Payer: Self-pay | Admitting: Physician Assistant

## 2017-08-25 DIAGNOSIS — E119 Type 2 diabetes mellitus without complications: Secondary | ICD-10-CM

## 2017-08-29 ENCOUNTER — Telehealth: Payer: Self-pay

## 2017-08-29 NOTE — Telephone Encounter (Signed)
No nodule or other abnormality  If any other questions, Vinetta BergamoCharley can address at follow-up

## 2017-08-29 NOTE — Telephone Encounter (Signed)
Patient called wanted her results of her thyroid ultrasound done on 08/24/2017. They results are back but its not read by PCP. Please advise me on what to tell the patient. Please advise Vinetta BergamoCharley is out of the office until further notice. Rhonda Cunningham,CMA

## 2017-08-30 NOTE — Telephone Encounter (Signed)
Message left on vm 

## 2017-09-04 ENCOUNTER — Ambulatory Visit: Payer: BLUE CROSS/BLUE SHIELD | Admitting: Physician Assistant

## 2017-09-04 ENCOUNTER — Encounter: Payer: Self-pay | Admitting: Physician Assistant

## 2017-09-04 VITALS — BP 136/80 | HR 77 | Wt 230.0 lb

## 2017-09-04 DIAGNOSIS — R7989 Other specified abnormal findings of blood chemistry: Secondary | ICD-10-CM

## 2017-09-04 NOTE — Patient Instructions (Addendum)
Appointment with [facility] 09/18/17 at 2:00 pm  Calcitonin Test Why am I having this test? The calcitonin test is performed to measure the amount of calcitonin in your blood. Calcitonin is a hormone put out (secreted) by the thyroid gland. This test may be done if your health care provider suspects that you may have a medullary cancer of the thyroid. The test is also used to measure the response to treatment for this type of cancer and to help predict whether the cancer will return. For people with a family history of medullary cancer, this test can also be used as a screening test. What kind of sample is taken? Blood samples are required for this test. Samples will be collected before and after you have received an injection or infusion of medicine. The blood samples are usually collected by inserting a needle into a vein. How do I prepare for this test? Do not eat or drink anything other than water after midnight on the night before the test or as directed by your health care provider. What are the reference ranges? Reference ranges are considered healthy ranges established after testing a large group of healthy people. Reference ranges may vary among different people, labs, and hospitals. It is your responsibility to obtain your test results. Ask the lab or department performing the test when and how you will get your results. The reference ranges for this test are as follows:  Basal (plasma) reference ranges: ? Males: less than or equal to 19 pg/mL or less than or equal to 19 ng/L (SI units). ? Females: less than or equal to 14 pg/mL or less than or equal to 14 ng/L (SI units).  Calcium infusion reference ranges (2.4 mg/kg): ? Males: less than or equal to 190 pg/mL or less than or equal to 190 ng/L. ? Females: less than or equal to 130 pg/mL or less than or equal to 130 ng/L.  Pentagastrin injection reference ranges (0.5 mcg/kg): ? Males: less than or equal to 110 pg/mL or less than  or equal to 110 ng/L. ? Females: less than or equal to 30 pg/mL or less than or equal to 30 ng/L.  What do the results mean? Values higher than the reference ranges may indicate:  Medullary cancer of the thyroid.  Other types of cancer.  Overactive parathyroid gland (hyperparathyroidism).  Pernicious anemia, or vitamin B12 deficiency anemia.  Alcoholic cirrhosis.  Other endocrine disorders.  Talk with your health care provider to discuss your results, treatment options, and if necessary, the need for more tests. Talk with your health care provider if you have any questions about your results. Talk with your health care provider to discuss your results, treatment options, and if necessary, the need for more tests. Talk with your health care provider if you have any questions about your results. This information is not intended to replace advice given to you by your health care provider. Make sure you discuss any questions you have with your health care provider. Document Released: 10/18/2004 Document Revised: 05/31/2016 Document Reviewed: 02/18/2014 Elsevier Interactive Patient Education  [employer]2018 [employer]

## 2017-09-04 NOTE — Progress Notes (Signed)
HPI:                                                                Mary Clarke is a 46 y.o. female who presents to [facility]: Primary Care Sports Medicine today to review diagnostic results  [name] with PMH of DM II, PCOS, HTN, HLD, MR was found to have elevated calcitonin level x 2 approximately 3 weeks ago. She underwent a thyroid ultrasound, which was negative for nodule or focal abnormality. Denies family history of thyroid cancer. Reports brother had hypothyroidism. She has an appointment with endocrinology scheduled next month.  Past Medical History:  Diagnosis Date  . Allergy   . Anxiety   . Chronic bronchitis (HCC)    per pt stopped when I quit smoking  . Depression   . Diabetes mellitus without complication (HCC)   . Elevated calcitonin level 08/15/2017  . Heart murmur   . Hyperlipidemia   . Hypertension   . PCOS (polycystic ovarian syndrome)   . Severe mitral regurgitation by prior echocardiogram 01/04/2017  . Sleep apnea    Past Surgical History:  Procedure Laterality Date  . CHOLECYSTECTOMY    . RIGHT/LEFT HEART CATH AND CORONARY ANGIOGRAPHY Right 04/07/2017   Procedure: Right/Left Heart Cath and Coronary Angiography;  Surgeon: Lyn RecordsSmith, Henry W, MD;  [facility];  Service: Cardiovascular;  Laterality: Right;  . TEE WITHOUT CARDIOVERSION N/A 04/07/2017   Procedure: TRANSESOPHAGEAL ECHOCARDIOGRAM (TEE);  Surgeon: Lewayne Buntingrenshaw, Brian S, MD;  [facility];  Service: Cardiovascular;  Laterality: N/A;   Social History   Tobacco Use  . Smoking status: Former Smoker    Packs/day: 1.00    Years: 15.00    Pack years: 15.00    Types: Cigarettes    Last attempt to quit: 06/11/2015    Years since quitting: 2.2  . Smokeless tobacco: Never Used  . Tobacco comment: 2nd hand smoke exposure current  Substance Use Topics  . Alcohol use: No   family history includes Aneurysm in her brother; Diabetes in her brother and  brother; Heart attack (age of onset: 6443) in her brother; Heart attack (age of onset: 6144) in her father; Heart attack (age of onset: 10955) in her mother; Hypertension in her brother and mother; Lung cancer in her brother; Stroke in her brother.  ROS: negative except as noted in the HPI  Medications: Current Outpatient Medications  Medication Sig Dispense Refill  . aspirin 81 MG chewable tablet Chew 81 mg by mouth once.    Marland Kitchen. atorvastatin (LIPITOR) 40 MG tablet Take 1 tablet (40 mg total) by mouth daily. 90 tablet 3  . fenofibrate (TRICOR) 145 MG tablet Take 1 tablet (145 mg total) by mouth at bedtime. 30 tablet 5  . fluticasone (FLONASE) 50 MCG/ACT nasal spray Place 1 spray into both nostrils daily as needed for allergies or rhinitis.    Marland Kitchen. levocetirizine (XYZAL) 5 MG tablet Take 1 tablet by mouth daily as needed (allergies).     . losartan (COZAAR) 50 MG tablet Take 2 tablets (100 mg total) by mouth daily. 90 tablet 3  . metFORMIN (GLUCOPHAGE) 1000 MG tablet TAKE 1 TABLET (1,000 MG TOTAL) BY MOUTH 2 (TWO) TIMES DAILY WITH A MEAL. 180 tablet 0  .  Multiple Vitamin (MULTIVITAMIN) tablet Take 1 tablet by mouth daily.     No current facility-administered medications for this visit.    No Known Allergies     Objective:  BP 136/80   Pulse 77   Wt 230 lb (104.3 kg)   BMI 41.40 kg/m  Gen:  alert, not ill-appearing, no distress, appropriate for age, obese female HEENT: head normocephalic without obvious abnormality, conjunctiva and cornea clear, wearing glasses, trachea midline Pulm: Normal work of breathing, normal phonation  Neuro: alert and oriented x 3, no tremor MSK: extremities atraumatic, normal gait and station Skin: intact, no rashes on exposed skin, no jaundice, no cyanosis     No results found for this or any previous visit (from the past 72 hour(s)). No results found.    Assessment and Plan: 46 y.o. female with   1. High serum calcitonin - negative thyroid US on  08/24/17 - follow-up with endocrinology in 2 weeks   Patient education and anticipatory guidance given Patient agrees with treatment plan Follow-up in 5 months for diabetes or sooner as needed if symptoms worsen or fail to improve  Levonne Hubertharley E. Tammy Wickliffe PA-C

## 2017-09-27 ENCOUNTER — Other Ambulatory Visit: Payer: Self-pay | Admitting: *Deleted

## 2017-09-27 MED ORDER — LOSARTAN POTASSIUM 50 MG PO TABS: 100.0000 mg | ORAL_TABLET | Freq: Every day | ORAL | 3 refills | Status: DC

## 2017-09-28 ENCOUNTER — Other Ambulatory Visit: Payer: Self-pay

## 2017-09-28 MED ORDER — LOSARTAN POTASSIUM 100 MG PO TABS: 100.0000 mg | ORAL_TABLET | Freq: Every day | ORAL | 2 refills | Status: DC

## 2017-09-28 NOTE — Telephone Encounter (Signed)
Rx(s) sent to pharmacy electronically.  

## 2017-10-02 DIAGNOSIS — Z7982 Long term (current) use of aspirin: Secondary | ICD-10-CM | POA: Diagnosis not present

## 2017-10-02 DIAGNOSIS — R05 Cough: Secondary | ICD-10-CM | POA: Diagnosis not present

## 2017-10-02 DIAGNOSIS — R51 Headache: Secondary | ICD-10-CM | POA: Diagnosis not present

## 2017-10-02 DIAGNOSIS — E282 Polycystic ovarian syndrome: Secondary | ICD-10-CM | POA: Diagnosis not present

## 2017-10-02 DIAGNOSIS — Z79899 Other long term (current) drug therapy: Secondary | ICD-10-CM | POA: Diagnosis not present

## 2017-10-02 DIAGNOSIS — E119 Type 2 diabetes mellitus without complications: Secondary | ICD-10-CM | POA: Diagnosis not present

## 2017-10-02 DIAGNOSIS — J0110 Acute frontal sinusitis, unspecified: Secondary | ICD-10-CM | POA: Diagnosis not present

## 2017-10-02 DIAGNOSIS — Z7984 Long term (current) use of oral hypoglycemic drugs: Secondary | ICD-10-CM | POA: Diagnosis not present

## 2017-10-02 DIAGNOSIS — J0100 Acute maxillary sinusitis, unspecified: Secondary | ICD-10-CM | POA: Diagnosis not present

## 2017-10-02 DIAGNOSIS — R509 Fever, unspecified: Secondary | ICD-10-CM | POA: Diagnosis not present

## 2017-10-02 DIAGNOSIS — Z87891 Personal history of nicotine dependence: Secondary | ICD-10-CM | POA: Diagnosis not present

## 2017-10-02 DIAGNOSIS — Z7952 Long term (current) use of systemic steroids: Secondary | ICD-10-CM | POA: Diagnosis not present

## 2017-10-02 DIAGNOSIS — J029 Acute pharyngitis, unspecified: Secondary | ICD-10-CM | POA: Diagnosis not present

## 2017-10-20 ENCOUNTER — Encounter: Payer: Self-pay | Admitting: Physician Assistant

## 2017-10-20 DIAGNOSIS — E1159 Type 2 diabetes mellitus with other circulatory complications: Secondary | ICD-10-CM | POA: Diagnosis not present

## 2017-10-20 DIAGNOSIS — R7989 Other specified abnormal findings of blood chemistry: Secondary | ICD-10-CM | POA: Diagnosis not present

## 2017-10-20 DIAGNOSIS — R232 Flushing: Secondary | ICD-10-CM | POA: Diagnosis not present

## 2017-10-20 DIAGNOSIS — E1169 Type 2 diabetes mellitus with other specified complication: Secondary | ICD-10-CM | POA: Diagnosis not present

## 2017-10-23 ENCOUNTER — Other Ambulatory Visit: Payer: Self-pay | Admitting: Physician Assistant

## 2017-10-23 DIAGNOSIS — R232 Flushing: Secondary | ICD-10-CM

## 2017-10-23 DIAGNOSIS — E349 Endocrine disorder, unspecified: Secondary | ICD-10-CM

## 2017-10-31 ENCOUNTER — Telehealth: Payer: Self-pay | Admitting: Pharmacist Clinician (PhC)/ Clinical Pharmacy Specialist

## 2017-10-31 ENCOUNTER — Other Ambulatory Visit: Payer: Self-pay | Admitting: Pharmacist Clinician (PhC)/ Clinical Pharmacy Specialist

## 2017-10-31 DIAGNOSIS — E785 Hyperlipidemia, unspecified: Secondary | ICD-10-CM

## 2017-10-31 DIAGNOSIS — R232 Flushing: Secondary | ICD-10-CM | POA: Diagnosis not present

## 2017-10-31 NOTE — Telephone Encounter (Signed)
LMOM - patient started fenofibrate 145 back in September.  Will need to get updated labs and possible follow up appointment

## 2017-10-31 NOTE — Telephone Encounter (Signed)
F/u Message  Pt call requesting to speak with RN about getting the information for her lab work mailed to her [place] to be [place] in [place].

## 2017-11-06 DIAGNOSIS — R232 Flushing: Secondary | ICD-10-CM | POA: Diagnosis not present

## 2017-11-06 DIAGNOSIS — R51 Headache: Secondary | ICD-10-CM | POA: Diagnosis not present

## 2017-11-06 DIAGNOSIS — G9389 Other specified disorders of brain: Secondary | ICD-10-CM | POA: Diagnosis not present

## 2017-11-06 DIAGNOSIS — I6781 Acute cerebrovascular insufficiency: Secondary | ICD-10-CM | POA: Diagnosis not present

## 2017-11-08 ENCOUNTER — Telehealth: Payer: Self-pay | Admitting: Cardiology

## 2017-11-08 DIAGNOSIS — E781 Pure hyperglyceridemia: Secondary | ICD-10-CM | POA: Diagnosis not present

## 2017-11-08 LAB — LIPID PANEL
CHOLESTEROL: 157 mg/dL (ref <200)
HDL: 21 mg/dL — AB (ref >50)
Non-HDL Cholesterol (Calc): 136 mg/dL (calc) — ABNORMAL HIGH (ref <130)
TRIGLYCERIDES: 463 mg/dL — AB (ref <150)
Total CHOL/HDL Ratio: 7.5 (calc) — ABNORMAL HIGH (ref <5.0)

## 2017-11-08 LAB — HEPATIC FUNCTION PANEL
AG RATIO: 1.4 (calc) (ref 1.0–2.5)
ALBUMIN MSPROF: 4.1 g/dL (ref 3.6–5.1)
ALT: 30 U/L — ABNORMAL HIGH (ref 6–29)
AST: 22 U/L (ref 10–35)
Alkaline phosphatase (APISO): 41 U/L (ref 33–115)
BILIRUBIN DIRECT: 0.1 mg/dL (ref 0.0–0.2)
BILIRUBIN INDIRECT: 0.4 mg/dL (ref 0.2–1.2)
Globulin: 2.9 g/dL (calc) (ref 1.9–3.7)
Total Bilirubin: 0.5 mg/dL (ref 0.2–1.2)
Total Protein: 7 g/dL (ref 6.1–8.1)

## 2017-11-08 NOTE — Telephone Encounter (Signed)
Spoke with [place] Meter[place]ul, lab orders changed to [facility] for the [place] office lab.

## 2017-11-15 DIAGNOSIS — R51 Headache: Secondary | ICD-10-CM | POA: Diagnosis not present

## 2017-11-20 ENCOUNTER — Other Ambulatory Visit: Payer: Self-pay

## 2017-11-20 DIAGNOSIS — E119 Type 2 diabetes mellitus without complications: Secondary | ICD-10-CM

## 2017-11-20 MED ORDER — METFORMIN HCL 1000 MG PO TABS: 1000.0000 mg | ORAL_TABLET | Freq: Two times a day (BID) | ORAL | 0 refills | Status: DC

## 2017-11-21 ENCOUNTER — Other Ambulatory Visit: Payer: Self-pay

## 2017-11-21 DIAGNOSIS — E119 Type 2 diabetes mellitus without complications: Secondary | ICD-10-CM

## 2017-11-21 MED ORDER — METFORMIN HCL 1000 MG PO TABS: 1000.0000 mg | ORAL_TABLET | Freq: Two times a day (BID) | ORAL | 0 refills | Status: DC

## 2017-12-04 ENCOUNTER — Other Ambulatory Visit: Payer: Self-pay | Admitting: Pharmacist Clinician (PhC)/ Clinical Pharmacy Specialist

## 2017-12-04 DIAGNOSIS — E282 Polycystic ovarian syndrome: Secondary | ICD-10-CM | POA: Diagnosis not present

## 2017-12-04 DIAGNOSIS — R55 Syncope and collapse: Secondary | ICD-10-CM | POA: Diagnosis not present

## 2017-12-04 DIAGNOSIS — E119 Type 2 diabetes mellitus without complications: Secondary | ICD-10-CM | POA: Diagnosis not present

## 2017-12-04 DIAGNOSIS — Z87891 Personal history of nicotine dependence: Secondary | ICD-10-CM | POA: Diagnosis not present

## 2017-12-04 DIAGNOSIS — Z7951 Long term (current) use of inhaled steroids: Secondary | ICD-10-CM | POA: Diagnosis not present

## 2017-12-04 DIAGNOSIS — Z7984 Long term (current) use of oral hypoglycemic drugs: Secondary | ICD-10-CM | POA: Diagnosis not present

## 2017-12-04 DIAGNOSIS — Z79899 Other long term (current) drug therapy: Secondary | ICD-10-CM | POA: Diagnosis not present

## 2017-12-04 DIAGNOSIS — Z7982 Long term (current) use of aspirin: Secondary | ICD-10-CM | POA: Diagnosis not present

## 2017-12-04 DIAGNOSIS — R0981 Nasal congestion: Secondary | ICD-10-CM | POA: Diagnosis not present

## 2017-12-04 DIAGNOSIS — J0101 Acute recurrent maxillary sinusitis: Secondary | ICD-10-CM | POA: Diagnosis not present

## 2017-12-04 DIAGNOSIS — R509 Fever, unspecified: Secondary | ICD-10-CM | POA: Diagnosis not present

## 2017-12-04 DIAGNOSIS — R42 Dizziness and giddiness: Secondary | ICD-10-CM | POA: Diagnosis not present

## 2017-12-04 MED ORDER — EVOLOCUMAB 140 MG/ML ~~LOC~~ SOAJ: 140.0000 mg | SUBCUTANEOUS | 12 refills | Status: DC

## 2017-12-08 ENCOUNTER — Encounter: Payer: Self-pay | Admitting: Physician Assistant

## 2017-12-08 ENCOUNTER — Ambulatory Visit: Payer: BLUE CROSS/BLUE SHIELD | Admitting: Physician Assistant

## 2017-12-08 VITALS — BP 104/72 | HR 88 | Temp 98.1°F | Wt 233.0 lb

## 2017-12-08 DIAGNOSIS — R55 Syncope and collapse: Secondary | ICD-10-CM

## 2017-12-08 DIAGNOSIS — Z1329 Encounter for screening for other suspected endocrine disorder: Secondary | ICD-10-CM

## 2017-12-08 DIAGNOSIS — Z09 Encounter for follow-up examination after completed treatment for conditions other than malignant neoplasm: Secondary | ICD-10-CM

## 2017-12-08 DIAGNOSIS — R232 Flushing: Secondary | ICD-10-CM | POA: Diagnosis not present

## 2017-12-08 DIAGNOSIS — R221 Localized swelling, mass and lump, neck: Secondary | ICD-10-CM

## 2017-12-08 NOTE — Patient Instructions (Signed)
Near-Syncope °Near-syncope is when you suddenly get weak or dizzy, or you feel like you might pass out (faint). During an episode of near-syncope, you may: °· Feel dizzy or light-headed. °· Feel sick to your stomach (nauseous). °· See all white or all black. °· Have cold, clammy skin. ° °If you passed out, get help right away.Call your local emergency services (911 in the [place] Do not drive yourself to the hospital. °Follow these instructions at home: °Pay attention to any changes in your symptoms. Take these actions to help with your condition: °· Have someone stay with you until you feel stable. °· Do not drive, use machinery, or play sports until your doctor says it is okay. °· Keep all follow-up visits as told by your doctor. This is important. °· If you start to feel like you might pass out, lie down right away and raise (elevate) your feet above the level of your heart. Breathe deeply and steadily. Wait until all of the symptoms are gone. °· Drink enough fluid to keep your pee (urine) clear or pale yellow. °· If you are taking blood pressure or heart medicine, get up slowly and spend many minutes getting ready to sit and then stand. This can help with dizziness. °· Take over-the-counter and prescription medicines only as told by your doctor. ° °Get help right away if: °· You have a very bad headache. °· You have unusual pain in your chest, tummy, or back. °· You are bleeding from your mouth or rectum. °· You have black or tarry poop (stool). °· You have a very fast or uneven heartbeat (palpitations). °· You pass out one time or more than once. °· You have jerky movements that you cannot control (seizure). °· You are confused. °· You have trouble walking. °· You are very weak. °· You have vision problems. °These symptoms may be an emergency. Do not wait to see if the symptoms will go away. Get medical help right away. Call your local emergency services (911 in the [place] Do not drive yourself to the  hospital. °This information is not intended to replace advice given to you by your health care provider. Make sure you discuss any questions you have with your health care provider. °Document Released: 03/14/2008 Document Revised: 03/03/2016 Document Reviewed: 06/10/2015 °Elsevier Interactive Patient Education © 2017 Elsevier Inc. ° °

## 2017-12-08 NOTE — Progress Notes (Signed)
Subjective:    I'm seeing this patient as a consultation for: Mary Frayharley Cummings, PA-C  CC: Several odd issues  HPI: This is a pleasant 47 year old female, she has coronary artery disease, no stenting, history of diabetes mellitus type 2.  Over the past 6 months she is noted an increase in flushing of her left face, drooping of her left eyelid, blurriness of the vision, as well as pain shooting into the left side of her face.  It occurs fairly randomly.  She has been seen by neurology, there is a running diagnosis of trigeminal neuralgia.  Also had a brain MRI that did show a prominent vessel near the left trigeminal nerve.  She was initially started on gabapentin and Tegretol, gabapentin created excessive grogginess and Tegretol created some headaches, both are started on relatively low doses.  No upper or lower respiratory symptoms, hemoptysis, no weight loss.  She is a non-smoker.  She was placed on prednisone by her neurologist, for 3 weeks, but did not notice any improvement in her symptoms.  Though she was tapered down slowly when the prednisone stopped started to get episodes of sweating, presyncope.  No chest pain, palpitations during these episodes, no increased stress or anxiety or panic during these episodes either.  I reviewed the past medical history, family history, social history, surgical history, and allergies today and no changes were needed.  Please see the problem list section below in epic for further details.  Past Medical History: Past Medical History:  Diagnosis Date  . Allergy   . Anxiety   . Chronic bronchitis (HCC)    per pt stopped when I quit smoking  . Depression   . Diabetes mellitus without complication (HCC)   . Elevated calcitonin level 08/15/2017  . Heart murmur   . Hyperlipidemia   . Hypertension   . PCOS (polycystic ovarian syndrome)   . Severe mitral regurgitation by prior echocardiogram 01/04/2017  . Sleep apnea    Past Surgical History: Past  Surgical History:  Procedure Laterality Date  . CHOLECYSTECTOMY    . RIGHT/LEFT HEART CATH AND CORONARY ANGIOGRAPHY Right 04/07/2017   Procedure: Right/Left Heart Cath and Coronary Angiography;  Surgeon: Lyn RecordsSmith, Henry W, MD;  [facility];  Service: Cardiovascular;  Laterality: Right;  . TEE WITHOUT CARDIOVERSION N/A 04/07/2017   Procedure: TRANSESOPHAGEAL ECHOCARDIOGRAM (TEE);  Surgeon: Lewayne Buntingrenshaw, Brian S, MD;  [facility];  Service: Cardiovascular;  Laterality: N/A;   Social History: Social History   Socioeconomic History  . Marital status: Divorced    Spouse name: None  . Number of children: None  . Years of education: None  . Highest education level: None  Social Needs  . Financial resource strain: None  . Food insecurity - worry: None  . Food insecurity - inability: None  . Transportation needs - medical: None  . Transportation needs - non-medical: None  Occupational History    Comment: Retail  Tobacco Use  . Smoking status: Former Smoker    Packs/day: 1.00    Years: 15.00    Pack years: 15.00    Types: Cigarettes    Last attempt to quit: 06/11/2015    Years since quitting: 2.4  . Smokeless tobacco: Never Used  . Tobacco comment: 2nd hand smoke exposure current  Substance and Sexual Activity  . Alcohol use: No  . Drug use: No  . Sexual activity: Not Currently  Other Topics Concern  . None  Social History Narrative  . None  Family History: Family History  Problem Relation Age of Onset  . Heart attack Mother 16  . Hypertension Mother   . Heart attack Father 6  . Diabetes Brother   . Aneurysm Brother   . Hypertension Brother   . Stroke Brother   . Heart attack Brother 43  . Thyroid disease Brother   . Lung cancer Brother   . Diabetes Brother    Allergies: No Known Allergies Medications: See med rec.  Review of Systems: No headache, visual changes, nausea, vomiting, diarrhea, constipation, dizziness, abdominal pain, skin rash,  fevers, chills, night sweats, weight loss, swollen lymph nodes, body aches, joint swelling, muscle aches, chest pain, shortness of breath, mood changes, visual or auditory hallucinations.   Objective:   General: Well Developed, well nourished, and in no acute distress.  Neuro:  Extra-ocular muscles intact, able to move all 4 extremities, sensation grossly intact.  Deep tendon reflexes tested were normal.  Cranial nerves II through XII are overall intact, some droop of the left eyelid. Psych: Alert and oriented, mood congruent with affect. ENT:  Ears and nose appear unremarkable.  Hearing grossly normal.  Drooping of the left eyelid, pupils equal round reactive to light, accommodation.  Fullness in left side of the neck.  Tends to hold her head tilted to the left. Neck: Unremarkable overall appearance, trachea midline.  No visible thyroid enlargement. Eyes: Conjunctivae and lids appear unremarkable. Skin: Warm and dry, no rashes noted.  Cardiovascular: Pulses palpable, no extremity edema.   Impression and Recommendations:   This case required medical decision making of moderate complexity.  Left-sided facial flushing, eyelid droop, pain Unclear etiology, there were some odd changes, essentially leukomalacia in the left parietal and occipital lobe. There is also a prominent arterial structure next to the left trigeminal nerve which is commonly seen with trigeminal neuralgia. I do think ultimately her diagnosis is going to be a trigeminal neuralgia, she was intolerant of gabapentin and Tegretol, I think we will probably work with neurology to give this another try in the future. 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  Presyncope Has been having episodes of sweating, presyncopal episodes that do not sound quite consistent with vasovagal syncope. Considering her long course of  prednisone, even with the taper, when she came off I do suspect that her episodes of dizziness, sweating, weakness may be an element of adrenal suppression, we are going to get morning cortisol levels, as well as another panel of blood work. ___________________________________________ Ihor Austin. Benjamin Stain, M.D., ABFM., CAQSM. Primary Care and Sports Medicine [facility]  Adjunct Instructor of Family Medicine  [employer]

## 2017-12-08 NOTE — Assessment & Plan Note (Addendum)
Unclear etiology, there were some odd changes, essentially leukomalacia in the left parietal and occipital lobe. There is also a prominent arterial structure next to the left trigeminal nerve which is commonly seen with trigeminal neuralgia. I do think ultimately her diagnosis is going to be a trigeminal neuralgia, she was intolerant of gabapentin and Tegretol, I think we will probably work with neurology to give this another try in the future. 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

## 2017-12-08 NOTE — Progress Notes (Signed)
HPI:                                                                Mary Clarke is a 47 y.o. female who presents to [name]: Primary Care Sports Medicine today for pre-syncope  This is a pleasant 47 yo F with complicated medical history that includes non-obstructive CAD, moderate MR, dyslipidemia, DM 2, PCOS, headaches and facial flushing. For the last 5 days she has had intermittent episodes of lightheadedness where she feels like she is going to pass out. Lightheadedness is associated with diaphoresis, nausea, and "fuzzy" vision. 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 No associated chest pain, palpitations, dyspnea, or LOC. She has been sent home from work twice for this. She was evaluated in the ED 5 days ago and diagnosed with acute sinusitis and treated with Augmentin. Most recent episode occurred around 8 am this morning.  She is currently under the care of neurology for facial flushing, ptosis and blurred vision felt to be trigeminal neuralgia. MRI Head w/ and w/o contrast on 11/06/17 showed leukoaraisosis, periventricular leukomalacia, and a prominent blood vessel adjacent to the left trigeminal nerve. She has unfortunately been unable to tolerate Gabapentin or Carbamazepine.   Depression screen [facility] 2/9 08/03/2017 11/17/2016  Decreased Interest 1 1  Down, Depressed, Hopeless 2 2  PHQ - 2 Score 3 3  Altered sleeping 2 3  Tired, decreased energy 3 3  Change in appetite 1 0  Feeling bad or failure about yourself  0 0  Trouble concentrating 1 1  Moving slowly or fidgety/restless 0 0  Suicidal thoughts 0 0  PHQ-9 Score 10 10    GAD 7 : Generalized Anxiety Score 11/17/2016  Nervous, Anxious, on Edge 1  Control/stop worrying 2  Worry too much - different things 2  Trouble relaxing 3  Restless 0  Easily annoyed or irritable 3  Afraid - awful might happen 0  Total GAD 7 Score 11      Past Medical History:  Diagnosis Date  .  Allergy   . Anxiety   . Chronic bronchitis (HCC)    per pt stopped when I quit smoking  . Depression   . Diabetes mellitus without complication (HCC)   . Elevated calcitonin level 08/15/2017  . Heart murmur   . Hyperlipidemia   . Hypertension   . PCOS (polycystic ovarian syndrome)   . Severe mitral regurgitation by prior echocardiogram 01/04/2017  . Sleep apnea    Past Surgical History:  Procedure Laterality Date  . CHOLECYSTECTOMY    . RIGHT/LEFT HEART CATH AND CORONARY ANGIOGRAPHY Right 04/07/2017   Procedure: Right/Left Heart Cath and Coronary Angiography;  Surgeon: Lyn Records, MD;  [facility] INVASIVE CV LAB;  Service: Cardiovascular;  Laterality: Right;  . TEE WITHOUT CARDIOVERSION N/A 04/07/2017   Procedure: TRANSESOPHAGEAL ECHOCARDIOGRAM (TEE);  Surgeon: Lewayne Bunting, MD;  [facility]: [facility] ENDOSCOPY;  Service: Cardiovascular;  Laterality: N/A;   Social History   Tobacco Use  . Smoking status: Former Smoker    Packs/day: 1.00    Years: 15.00    Pack years: 15.00    Types: Cigarettes    Last attempt to quit: 06/11/2015    Years since quitting: 2.4  .  Smokeless tobacco: Never Used  . Tobacco comment: 2nd hand smoke exposure current  Substance Use Topics  . Alcohol use: No   family history includes Aneurysm in her brother; Diabetes in her brother and brother; Heart attack (age of onset: 9243) in her brother; Heart attack (age of onset: 5644) in her father; Heart attack (age of onset: 6955) in her mother; Hypertension in her brother and mother; Lung cancer in her brother; Stroke in her brother; Thyroid disease in her brother.    ROS: negative except as noted in the HPI  Medications: Current Outpatient Medications  Medication Sig Dispense Refill  . aspirin 81 MG chewable tablet Chew 81 mg by mouth once.    Marland Kitchen. atorvastatin (LIPITOR) 40 MG tablet Take 1 tablet (40 mg total) by mouth daily. 90 tablet 3  . Evolocumab (REPATHA SURECLICK) 140 MG/ML SOAJ Inject 140 mg into the  skin every 14 (fourteen) days. 2 pen 12  . fenofibrate (TRICOR) 145 MG tablet Take 1 tablet (145 mg total) by mouth at bedtime. 30 tablet 5  . fluticasone (FLONASE) 50 MCG/ACT nasal spray Place 1 spray into both nostrils daily as needed for allergies or rhinitis.    Marland Kitchen. levocetirizine (XYZAL) 5 MG tablet Take 1 tablet by mouth daily as needed (allergies).     . losartan (COZAAR) 100 MG tablet Take 1 tablet (100 mg total) by mouth daily. 90 tablet 2  . metFORMIN (GLUCOPHAGE) 1000 MG tablet Take 1 tablet (1,000 mg total) by mouth 2 (two) times daily with a meal. 180 tablet 0  . Multiple Vitamin (MULTIVITAMIN) tablet Take 1 tablet by mouth daily.     No current facility-administered medications for this visit.    No Known Allergies     Objective:  BP 104/72   Pulse 88   Temp 98.1 F (36.7 C) (Oral)   Wt 233 lb (105.7 kg)   SpO2 94%   BMI 41.94 kg/m  Gen: well-groomed, not ill-appearing, no acute distress, appears older than stated age, obese female HEENT: head normocephalic, atraumatic, left-sided facial edema and neck fullness; conjunctiva and cornea clear, wearing glasses, oropharynx clear, moist mucus membranes; neck supple, no meningeal signs Pulm: Normal work of breathing, normal phonation, clear to auscultation bilaterally CV: Normal rate, regular rhythm, s1 and s2 distinct, systolic murmur Neuro:  cranial nerve 7 deficit, asymmetric brow raise, no nystagmus, normal finger-to-nose, normal heel-to-shin, negative pronator drift, normal rapid alternating movements, DTR's intact, normal tone, no tremor MSK: strength 5/5 and symmetric in bilateral upper and lower extremities, normal gait and station Mental Status: alert and oriented x 3, speech articulate, and thought processes clear and goal-directed      No results found for this or any previous visit (from the past 72 hour(s)). No results found.    Assessment and Plan: 47 y.o. female with   1. Localized swelling, mass or  lump of neck - CT Soft Tissue Neck W Contrast; Future  2. Pre-syncope - personally reviewed MR Brain report from 11/06/17, which shows a prominent vessel adjacent to the left trigeminal nerve, leukomalacia, and bilateral mastoid sinusitis. She has been treated for bacterial sinusitis. She recently underwent a heart cath in 2018 showing CAD, moderate MR and normal LVEF 55-60%. Pre-syncope has a prodrome of sweating and nausea. No LOC, amnesia or seizure activity. Due to complexity of patient's PMH, Dr. Benjamin Stainhekkekandam was consulted today. He recommended the following laboratory work-up - Cortisol, free, Serum - Sedimentation rate - CBC with Differential/Platelet - Comprehensive metabolic panel -  TSH + free T4  3. Facial flushing - she is followed by neurology who feel this may be a trigeminal neuralgia. Encouraged to contact them for a follow-up appointment - Cortisol, free, Serum - Sedimentation rate  4. Screening for thyroid disorder - TSH + free T4   Patient education and anticipatory guidance given Patient agrees with treatment plan Follow-up in 2 weeks or sooner as needed if symptoms worsen or fail to improve  Levonne Hubert PA-C

## 2017-12-08 NOTE — Assessment & Plan Note (Signed)
Has been having episodes of sweating, presyncopal episodes that do not sound quite consistent with vasovagal syncope. Considering her long course of prednisone, even with the taper, when she came off I do suspect that her episodes of dizziness, sweating, weakness may be an element of adrenal suppression, we are going to get morning cortisol levels, as well as another panel of blood work.

## 2017-12-11 ENCOUNTER — Telehealth: Payer: Self-pay | Admitting: Physician Assistant

## 2017-12-11 DIAGNOSIS — R232 Flushing: Secondary | ICD-10-CM | POA: Diagnosis not present

## 2017-12-11 DIAGNOSIS — R55 Syncope and collapse: Secondary | ICD-10-CM | POA: Diagnosis not present

## 2017-12-11 DIAGNOSIS — Z1329 Encounter for screening for other suspected endocrine disorder: Secondary | ICD-10-CM | POA: Diagnosis not present

## 2017-12-11 NOTE — Telephone Encounter (Signed)
Intermittent FMLA forms [facility] from [facility] and completed There is a release page that patient needs to sign before it can be faxed Forms will be at the front desk for her

## 2017-12-13 ENCOUNTER — Ambulatory Visit (INDEPENDENT_AMBULATORY_CARE_PROVIDER_SITE_OTHER): Payer: BLUE CROSS/BLUE SHIELD

## 2017-12-13 DIAGNOSIS — M47892 Other spondylosis, cervical region: Secondary | ICD-10-CM

## 2017-12-13 DIAGNOSIS — R221 Localized swelling, mass and lump, neck: Secondary | ICD-10-CM | POA: Diagnosis not present

## 2017-12-13 DIAGNOSIS — J018 Other acute sinusitis: Secondary | ICD-10-CM | POA: Diagnosis not present

## 2017-12-13 MED ORDER — IOPAMIDOL (ISOVUE-300) INJECTION 61%
100.0000 mL | Freq: Once | INTRAVENOUS | Status: AC | PRN
  Administered 2017-12-13: 75 mL via INTRAVENOUS

## 2017-12-13 NOTE — Telephone Encounter (Signed)
Pt notified -EH/RMA  

## 2017-12-14 ENCOUNTER — Other Ambulatory Visit: Payer: Self-pay | Admitting: Physician Assistant

## 2017-12-14 DIAGNOSIS — E1159 Type 2 diabetes mellitus with other circulatory complications: Secondary | ICD-10-CM | POA: Diagnosis not present

## 2017-12-14 DIAGNOSIS — J320 Chronic maxillary sinusitis: Secondary | ICD-10-CM

## 2017-12-14 DIAGNOSIS — R51 Headache: Secondary | ICD-10-CM | POA: Diagnosis not present

## 2017-12-14 DIAGNOSIS — I10 Essential (primary) hypertension: Secondary | ICD-10-CM | POA: Diagnosis not present

## 2017-12-14 MED ORDER — CEFDINIR 300 MG PO CAPS: 300.0000 mg | ORAL_CAPSULE | Freq: Two times a day (BID) | ORAL | 0 refills | Status: AC

## 2017-12-15 LAB — CBC WITH DIFFERENTIAL/PLATELET
BASOS ABS: 85 {cells}/uL (ref 0–200)
Basophils Relative: 0.9 %
EOS PCT: 1.9 %
Eosinophils Absolute: 179 cells/uL (ref 15–500)
HEMATOCRIT: 39.1 % (ref 35.0–45.0)
Hemoglobin: 13.2 g/dL (ref 11.7–15.5)
LYMPHS ABS: 2472 {cells}/uL (ref 850–3900)
MCH: 30.3 pg (ref 27.0–33.0)
MCHC: 33.8 g/dL (ref 32.0–36.0)
MCV: 89.9 fL (ref 80.0–100.0)
MPV: 11.4 fL (ref 7.5–12.5)
Monocytes Relative: 5.5 %
Neutro Abs: 6148 cells/uL (ref 1500–7800)
Neutrophils Relative %: 65.4 %
PLATELETS: 292 10*3/uL (ref 140–400)
RBC: 4.35 10*6/uL (ref 3.80–5.10)
RDW: 12.1 % (ref 11.0–15.0)
TOTAL LYMPHOCYTE: 26.3 %
WBC: 9.4 10*3/uL (ref 3.8–10.8)
WBCMIX: 517 {cells}/uL (ref 200–950)

## 2017-12-15 LAB — COMPREHENSIVE METABOLIC PANEL
AG Ratio: 1.3 (calc) (ref 1.0–2.5)
ALKALINE PHOSPHATASE (APISO): 36 U/L (ref 33–115)
ALT: 27 U/L (ref 6–29)
AST: 20 U/L (ref 10–35)
Albumin: 3.9 g/dL (ref 3.6–5.1)
BILIRUBIN TOTAL: 0.4 mg/dL (ref 0.2–1.2)
BUN: 25 mg/dL (ref 7–25)
CALCIUM: 9.4 mg/dL (ref 8.6–10.2)
CO2: 25 mmol/L (ref 20–32)
Chloride: 103 mmol/L (ref 98–110)
Creat: 0.77 mg/dL (ref 0.50–1.10)
Globulin: 3.1 g/dL (calc) (ref 1.9–3.7)
Glucose, Bld: 129 mg/dL — ABNORMAL HIGH (ref 65–99)
Potassium: 4.5 mmol/L (ref 3.5–5.3)
Sodium: 139 mmol/L (ref 135–146)
Total Protein: 7 g/dL (ref 6.1–8.1)

## 2017-12-15 LAB — SEDIMENTATION RATE: Sed Rate: 17 mm/h (ref 0–20)

## 2017-12-15 LAB — TSH+FREE T4: TSH W/REFLEX TO FT4: 1.57 m[IU]/L

## 2017-12-15 LAB — CORTISOL, FREE: CORTISOL FREE SERUM: 0.59 ug/dL

## 2017-12-17 ENCOUNTER — Other Ambulatory Visit: Payer: Self-pay | Admitting: Physician Assistant

## 2017-12-17 DIAGNOSIS — E781 Pure hyperglyceridemia: Secondary | ICD-10-CM

## 2017-12-17 DIAGNOSIS — E119 Type 2 diabetes mellitus without complications: Secondary | ICD-10-CM

## 2017-12-18 ENCOUNTER — Encounter: Payer: Self-pay | Admitting: Physician Assistant

## 2017-12-18 DIAGNOSIS — R221 Localized swelling, mass and lump, neck: Secondary | ICD-10-CM | POA: Insufficient documentation

## 2017-12-18 DIAGNOSIS — R55 Syncope and collapse: Secondary | ICD-10-CM | POA: Insufficient documentation

## 2017-12-18 NOTE — Progress Notes (Signed)
Misty StanleyLisa,  For chronic sinus infection, the treatment is 1. 8-12 weeks of Flonase, 1 spray each nostril daily 2. Neti pot / saline rinses three times daily  If you develop a fever or worsening symptoms follow-up in the office  Best, [name]

## 2017-12-18 NOTE — Progress Notes (Signed)
Hi Misty StanleyLisa,  Your most recent labs look great. Your cortisol (stress hormone) level is normal as are your cell counts, thyroid function and inflammatory markers.  Best, [name]

## 2017-12-18 NOTE — Telephone Encounter (Signed)
Should cardiology be filling this med for her? Please advise. -EH/RMA

## 2017-12-25 ENCOUNTER — Encounter: Payer: Self-pay | Admitting: Physician Assistant

## 2017-12-25 ENCOUNTER — Ambulatory Visit: Payer: BLUE CROSS/BLUE SHIELD | Admitting: Physician Assistant

## 2017-12-25 VITALS — BP 150/86 | HR 81 | Wt 232.0 lb

## 2017-12-25 DIAGNOSIS — J324 Chronic pansinusitis: Secondary | ICD-10-CM

## 2017-12-25 DIAGNOSIS — R42 Dizziness and giddiness: Secondary | ICD-10-CM | POA: Diagnosis not present

## 2017-12-25 DIAGNOSIS — R55 Syncope and collapse: Secondary | ICD-10-CM

## 2017-12-25 DIAGNOSIS — M47812 Spondylosis without myelopathy or radiculopathy, cervical region: Secondary | ICD-10-CM

## 2017-12-25 NOTE — Progress Notes (Signed)
HPI:                                                                Mary Clarke is a 47 y.o. female who presents to [facility] today for pre-syncope follow-up  Reports no change in symptoms since last visit. Continues to have 2-3 episodes per day or dizziness and near-syncope while standing at work. Associated with diaphoresis, nausea, facial flushing and blurred vision. Denies LOC, chest pain, palpitations, dyspnea. CT neck was ordered to assess for Horner syndrome and was unremarkable except for moderate paranasal sinus disease and moderate C5-C6 cervical spondylosis. Labs including CBC, CMP, ESR, TSH, free T4, cortisol all unremarkable.  She is currently under the care of neurology for facial flushing, ptosis and blurred vision felt to be trigeminal neuralgia. MRI Head w/ and w/o contrast on 11/06/17 showed leukoaraisosis, periventricular leukomalacia, and a prominent blood vessel adjacent to the left trigeminal nerve. She has unfortunately been unable to tolerate Gabapentin or Carbamazepine.  Reports neurologist changed her medication and she is now taking Nortriptyline in the evening; states she can only tolerate 20 mg. She has a follow-up appointment with neuro in April.   Depression screen [facility] 2/9 08/03/2017 11/17/2016  Decreased Interest 1 1  Down, Depressed, Hopeless 2 2  PHQ - 2 Score 3 3  Altered sleeping 2 3  Tired, decreased energy 3 3  Change in appetite 1 0  Feeling bad or failure about yourself  0 0  Trouble concentrating 1 1  Moving slowly or fidgety/restless 0 0  Suicidal thoughts 0 0  PHQ-9 Score 10 10    GAD 7 : Generalized Anxiety Score 11/17/2016  Nervous, Anxious, on Edge 1  Control/stop worrying 2  Worry too much - different things 2  Trouble relaxing 3  Restless 0  Easily annoyed or irritable 3  Afraid - awful might happen 0  Total GAD 7 Score 11      Past Medical History:  Diagnosis Date  . Allergy    . Anxiety   . Chronic bronchitis ([place])    per pt stopped when I quit smoking  . Depression   . Diabetes mellitus without complication ([place])   . Elevated calcitonin level 08/15/2017  . Heart murmur   . Hyperlipidemia   . Hypertension   . PCOS (polycystic ovarian syndrome)   . Severe mitral regurgitation by prior echocardiogram 01/04/2017  . Sleep apnea    Past Surgical History:  Procedure Laterality Date  . CHOLECYSTECTOMY    . RIGHT/LEFT HEART CATH AND CORONARY ANGIOGRAPHY Right 04/07/2017   Procedure: Right/Left Heart Cath and Coronary Angiography;  Surgeon: Belva Crome, MD;  Location: [place] CV LAB;  Service: Cardiovascular;  Laterality: Right;  . TEE WITHOUT CARDIOVERSION N/A 04/07/2017   Procedure: TRANSESOPHAGEAL ECHOCARDIOGRAM (TEE);  Surgeon: Lelon Perla, MD;  Location: [facility] ENDOSCOPY;  Service: Cardiovascular;  Laterality: N/A;   Social History   Tobacco Use  . Smoking status: Former Smoker    Packs/day: 1.00    Years: 15.00    Pack years: 15.00    Types: Cigarettes    Last attempt to quit: 06/11/2015    Years since quitting: 2.5  . Smokeless tobacco: Never Used  . Tobacco comment:  2nd hand smoke exposure current  Substance Use Topics  . Alcohol use: No   family history includes Aneurysm in her brother; Diabetes in her brother and brother; Heart attack (age of onset: 68) in her brother; Heart attack (age of onset: 61) in her father; Heart attack (age of onset: 62) in her mother; Hypertension in her brother and mother; Lung cancer in her brother; Stroke in her brother; Thyroid disease in her brother.    ROS: negative except as noted in the HPI  Medications: Current Outpatient Medications  Medication Sig Dispense Refill  . aspirin 81 MG chewable tablet Chew 81 mg by mouth once.    Marland Kitchen atorvastatin (LIPITOR) 40 MG tablet Take 1 tablet (40 mg total) by mouth at bedtime. 90 tablet 1  . Evolocumab (REPATHA SURECLICK) 664 MG/ML SOAJ Inject 140 mg into the skin  every 14 (fourteen) days. 2 pen 12  . fenofibrate (TRICOR) 145 MG tablet Take 1 tablet (145 mg total) by mouth at bedtime. 30 tablet 5  . fluticasone (FLONASE) 50 MCG/ACT nasal spray Place 1 spray into both nostrils daily as needed for allergies or rhinitis.    Marland Kitchen levocetirizine (XYZAL) 5 MG tablet Take 1 tablet by mouth daily as needed (allergies).     . losartan (COZAAR) 100 MG tablet Take 1 tablet (100 mg total) by mouth daily. 90 tablet 2  . metFORMIN (GLUCOPHAGE) 1000 MG tablet Take 1 tablet (1,000 mg total) by mouth 2 (two) times daily with a meal. 180 tablet 0  . Multiple Vitamin (MULTIVITAMIN) tablet Take 1 tablet by mouth daily.    . nortriptyline (PAMELOR) 10 MG capsule Take 3 capsules by mouth at bedtime.     No current facility-administered medications for this visit.    No Known Allergies     Objective:  BP (!) 150/86   Pulse 81   Wt 232 lb (105.2 kg)   BMI 41.76 kg/m  Gen:  alert, not ill-appearing, no distress, appropriate for age, obese female HEENT: head normocephalic without obvious abnormality, conjunctiva and cornea clear, wearing glasses, trachea midline Pulm: Normal work of breathing, normal phonation, clear to auscultation bilaterally CV: Normal rate, regular rhythm, s1 and s2 distinct, grade II/VI systolic murmur, clicks or rubs  Neuro: alert and oriented x 3, no tremor MSK: extremities atraumatic, normal gait and station Skin: intact, no rashes on exposed skin, no jaundice, no cyanosis Psych: well-groomed, cooperative, good eye contact,  speech is articulate, and thought processes clear and goal-directed  CLINICAL DATA:  47 y/o  F; left neck fullness.  EXAM: CT NECK WITH CONTRAST  TECHNIQUE: Multidetector CT imaging of the neck was performed using the standard protocol following the bolus administration of intravenous contrast.  CONTRAST:  54m ISOVUE-300 IOPAMIDOL (ISOVUE-300) INJECTION 61%  COMPARISON:  None.  FINDINGS: Pharynx and larynx:  Normal. No mass or swelling.  Salivary glands: No inflammation, mass, or stone.  Thyroid: Normal.  Lymph nodes: None enlarged or abnormal density.  Vascular: Negative.  Limited intracranial: Negative.  Visualized orbits: Negative.  Mastoids and visualized paranasal sinuses: Moderate diffuse paranasal sinus mucosal thickening and bilateral mastoid air cell opacification.  Skeleton: C5-6 moderate discogenic degenerative changes with loss of disc space height, is calcified disc bulge, and anterior endplate marginal osteophytes. No high-grade bony canal stenosis.  Upper chest: Negative.  Other: None.  IMPRESSION: 1. No mass or inflammatory process of the neck identified. No findings to account for neck fullness. 2. Cervical spondylosis with moderate discogenic degenerative changes C5-6. 3.  Moderate paranasal sinus disease and bilateral mastoid opacification.   Electronically Signed   By: Kristine Garbe M.D.   On: 12/13/2017 14:52  Lab Results  Component Value Date   WBC 9.4 12/11/2017   HGB 13.2 12/11/2017   HCT 39.1 12/11/2017   MCV 89.9 12/11/2017   PLT 292 12/11/2017   Lab Results  Component Value Date   CREATININE 0.77 12/11/2017   BUN 25 12/11/2017   NA 139 12/11/2017   K 4.5 12/11/2017   CL 103 12/11/2017   CO2 25 12/11/2017   Lab Results  Component Value Date   ALT 27 12/11/2017   AST 20 12/11/2017   ALKPHOS 87 05/17/2017   BILITOT 0.4 12/11/2017   Lab Results  Component Value Date   ESRSEDRATE 17 12/11/2017   Lab Results  Component Value Date   TSH 1.56 08/10/2017     No results found for this or any previous visit (from the past 72 hour(s)). No results found.    Assessment and Plan: 47 y.o. female with   1. Postural dizziness with presyncope - likely a sequelae of her trigeminal neuralgia. No red flag cardiac symptoms. We will order a cardiac event monitor to rule out dysrhythmia. Recommend she continue  Pamelor 20 mg QHS and keep follow-up with both neuro and cardiology.  2. Chronic pansinusitis - has been treated with Augmentin and Cefdinir x 10 days. Continues to have symptoms. Recommend daily nasal irrigation. She will follow-up with ENT.  3. Cervical spondylosis without myelopathy   Patient education and anticipatory guidance given Patient agrees with treatment plan Follow-up as needed if symptoms worsen or fail to improve  Darlyne Russian PA-C

## 2017-12-25 NOTE — Patient Instructions (Signed)
Near-Syncope °Near-syncope is when you suddenly get weak or dizzy, or you feel like you might pass out (faint). During an episode of near-syncope, you may: °· Feel dizzy or light-headed. °· Feel sick to your stomach (nauseous). °· See all white or all black. °· Have cold, clammy skin. ° °If you passed out, get help right away.Call your local emergency services (911 in the [place] Do not drive yourself to the hospital. °Follow these instructions at home: °Pay attention to any changes in your symptoms. Take these actions to help with your condition: °· Have someone stay with you until you feel stable. °· Do not drive, use machinery, or play sports until your doctor says it is okay. °· Keep all follow-up visits as told by your doctor. This is important. °· If you start to feel like you might pass out, lie down right away and raise (elevate) your feet above the level of your heart. Breathe deeply and steadily. Wait until all of the symptoms are gone. °· Drink enough fluid to keep your pee (urine) clear or pale yellow. °· If you are taking blood pressure or heart medicine, get up slowly and spend many minutes getting ready to sit and then stand. This can help with dizziness. °· Take over-the-counter and prescription medicines only as told by your doctor. ° °Get help right away if: °· You have a very bad headache. °· You have unusual pain in your chest, tummy, or back. °· You are bleeding from your mouth or rectum. °· You have black or tarry poop (stool). °· You have a very fast or uneven heartbeat (palpitations). °· You pass out one time or more than once. °· You have jerky movements that you cannot control (seizure). °· You are confused. °· You have trouble walking. °· You are very weak. °· You have vision problems. °These symptoms may be an emergency. Do not wait to see if the symptoms will go away. Get medical help right away. Call your local emergency services (911 in the [place] Do not drive yourself to the  hospital. °This information is not intended to replace advice given to you by your health care provider. Make sure you discuss any questions you have with your health care provider. °Document Released: 03/14/2008 Document Revised: 03/03/2016 Document Reviewed: 06/10/2015 °Elsevier Interactive Patient Education © 2017 Elsevier Inc. ° °

## 2017-12-26 ENCOUNTER — Other Ambulatory Visit: Payer: Self-pay | Admitting: Physician Assistant

## 2017-12-26 DIAGNOSIS — R55 Syncope and collapse: Principal | ICD-10-CM

## 2017-12-26 DIAGNOSIS — R42 Dizziness and giddiness: Secondary | ICD-10-CM

## 2017-12-26 NOTE — Progress Notes (Signed)
Order changed per PCP recommendation.

## 2018-01-01 ENCOUNTER — Encounter: Payer: Self-pay | Admitting: Physician Assistant

## 2018-01-01 DIAGNOSIS — M47812 Spondylosis without myelopathy or radiculopathy, cervical region: Secondary | ICD-10-CM | POA: Insufficient documentation

## 2018-01-01 DIAGNOSIS — J324 Chronic pansinusitis: Secondary | ICD-10-CM | POA: Insufficient documentation

## 2018-01-02 ENCOUNTER — Other Ambulatory Visit: Payer: Self-pay | Admitting: Cardiology

## 2018-01-02 NOTE — Telephone Encounter (Signed)
REFILL 

## 2018-01-16 DIAGNOSIS — H906 Mixed conductive and sensorineural hearing loss, bilateral: Secondary | ICD-10-CM | POA: Diagnosis not present

## 2018-01-16 DIAGNOSIS — H6593 Unspecified nonsuppurative otitis media, bilateral: Secondary | ICD-10-CM | POA: Diagnosis not present

## 2018-01-18 ENCOUNTER — Encounter: Payer: Self-pay | Admitting: Cardiology

## 2018-01-22 DIAGNOSIS — J8410 Pulmonary fibrosis, unspecified: Secondary | ICD-10-CM | POA: Diagnosis not present

## 2018-01-22 DIAGNOSIS — R918 Other nonspecific abnormal finding of lung field: Secondary | ICD-10-CM | POA: Diagnosis not present

## 2018-01-22 DIAGNOSIS — R079 Chest pain, unspecified: Secondary | ICD-10-CM | POA: Diagnosis not present

## 2018-01-22 DIAGNOSIS — Z79899 Other long term (current) drug therapy: Secondary | ICD-10-CM | POA: Diagnosis not present

## 2018-01-22 DIAGNOSIS — Z87891 Personal history of nicotine dependence: Secondary | ICD-10-CM | POA: Diagnosis not present

## 2018-01-22 DIAGNOSIS — J40 Bronchitis, not specified as acute or chronic: Secondary | ICD-10-CM | POA: Diagnosis not present

## 2018-01-22 DIAGNOSIS — R011 Cardiac murmur, unspecified: Secondary | ICD-10-CM | POA: Diagnosis not present

## 2018-01-22 DIAGNOSIS — R0602 Shortness of breath: Secondary | ICD-10-CM | POA: Diagnosis not present

## 2018-01-22 DIAGNOSIS — E119 Type 2 diabetes mellitus without complications: Secondary | ICD-10-CM | POA: Diagnosis not present

## 2018-01-22 DIAGNOSIS — R0789 Other chest pain: Secondary | ICD-10-CM | POA: Diagnosis not present

## 2018-01-23 ENCOUNTER — Telehealth: Payer: Self-pay

## 2018-01-23 ENCOUNTER — Telehealth: Payer: Self-pay | Admitting: Cardiology

## 2018-01-23 NOTE — Telephone Encounter (Signed)
Cardiology. Thanks!

## 2018-01-23 NOTE — Telephone Encounter (Signed)
Pt reports that she was recently seen in the ED for chest pain.  She wants to know should she f/u with us or should she f/u with cardiology. Please advise. -EH/RMA

## 2018-01-23 NOTE — Telephone Encounter (Signed)
Pt notified of recommendations -EH/RMA  

## 2018-01-23 NOTE — Telephone Encounter (Signed)
New message  Pt verbalzied that she is calling for RN  Pt wants instructions to use the injections

## 2018-01-25 NOTE — Telephone Encounter (Signed)
Walked patient thru process of self injection with Repatha SureClick, over the phone.  Patient gave herself injection without issue.  Will call when she takes 5th dose and we can order labs.

## 2018-02-09 ENCOUNTER — Ambulatory Visit (INDEPENDENT_AMBULATORY_CARE_PROVIDER_SITE_OTHER): Payer: BLUE CROSS/BLUE SHIELD

## 2018-02-09 DIAGNOSIS — R42 Dizziness and giddiness: Secondary | ICD-10-CM | POA: Diagnosis not present

## 2018-02-09 DIAGNOSIS — R55 Syncope and collapse: Secondary | ICD-10-CM

## 2018-02-20 DIAGNOSIS — H02402 Unspecified ptosis of left eyelid: Secondary | ICD-10-CM | POA: Diagnosis not present

## 2018-02-20 DIAGNOSIS — H90A31 Mixed conductive and sensorineural hearing loss, unilateral, right ear with restricted hearing on the contralateral side: Secondary | ICD-10-CM | POA: Diagnosis not present

## 2018-02-20 DIAGNOSIS — E1159 Type 2 diabetes mellitus with other circulatory complications: Secondary | ICD-10-CM | POA: Diagnosis not present

## 2018-02-20 DIAGNOSIS — I10 Essential (primary) hypertension: Secondary | ICD-10-CM | POA: Diagnosis not present

## 2018-02-22 ENCOUNTER — Telehealth: Payer: Self-pay

## 2018-02-22 ENCOUNTER — Encounter: Payer: Self-pay | Admitting: Family Medicine

## 2018-02-22 ENCOUNTER — Ambulatory Visit: Payer: BLUE CROSS/BLUE SHIELD | Admitting: Family Medicine

## 2018-02-22 VITALS — BP 135/78 | HR 98 | Ht 62.5 in | Wt 239.0 lb

## 2018-02-22 DIAGNOSIS — H1011 Acute atopic conjunctivitis, right eye: Secondary | ICD-10-CM

## 2018-02-22 MED ORDER — POLYMYXIN B-TRIMETHOPRIM 10000-0.1 UNIT/ML-% OP SOLN: 1.0000 [drp] | OPHTHALMIC | 0 refills | Status: DC

## 2018-02-22 NOTE — Patient Instructions (Addendum)
Thank you for coming in today. Continue the allergy medication.  Use over-the-counter Zaditor eyedrops (Ketotifen) twice daily as needed for itching.  You can also use systaine artificial tears  If not better fill and take the antibiotic eye drop.   Let us know if you develop severe eye pain, or severe blurry vision or significant pain with light.   Warm compress is a good idea.    Allergic Conjunctivitis, Adult Allergic conjunctivitis is inflammation of the clear membrane that covers the white part of your eye and the inner surface of your eyelid (conjunctiva). The inflammation is caused by allergies. The blood vessels in the conjunctiva become inflamed and this causes the eyes to become red or pink. The eyes often feel itchy. Allergic conjunctivitis cannot be spread from one person to another person (is not contagious). What are the causes? This condition is caused by an allergic reaction. Common causes of an allergic reaction (allergens) include:  Outdoor allergens, such as: ? Pollen. ? Grass and weeds. ? Mold spores.  Indoor allergens, such as: ? Dust. ? Smoke. ? Mold. ? Pet dander. ? Animal hair.  What increases the risk? You may be more likely to develop this condition if you have a family history of allergies, such as:  Allergic rhinitis.  Bronchial asthma.  Atopic dermatitis.  What are the signs or symptoms? Symptoms of this condition include eyes that are:  Itchy.  Red.  Watery.  Puffy.  Your eyes may also:  Sting or burn.  Have clear drainage coming from them.  How is this diagnosed? This condition may be diagnosed by medical history and physical exam. If you have drainage from your eyes, it may be tested to rule out other causes of conjunctivitis. You may also need to see a health care provider who specializes in treating allergies (allergist) or eye conditions (ophthalmologist) for tests to confirm the diagnosis. You may have:  Skin tests to see  which allergens are causing your symptoms. These tests involve pricking the skin with a tiny needle and exposing the skin to small amounts of potential allergens to see if your skin reacts.  Blood tests.  Tissue scrapings from your eyelid. These will be examined under a microscope.  How is this treated? Treatments for this condition may include:  Cold cloths (compresses) to soothe itching and swelling.  Washing the face to remove allergens.  Eye drops. These may be prescription or over-the-counter. There are several different types. You may need to try different types to see which one works best for you. Your may need: ? Eye drops that block the allergic reaction (antihistamine). ? Eye drops that reduce swelling and irritation (anti-inflammatory). ? Steroid eye drops to lessen a severe reaction (vernal conjunctivitis).  Oral antihistamine medicines to reduce your allergic reaction. You may need these if eye drops do not help or are difficult to use.  Follow these instructions at home:  Avoid known allergens whenever possible.  Take or apply over-the-counter and prescription medicines only as told by your health care provider. These include any eye drops.  Apply a cool, clean washcloth to your eye for 10-20 minutes, 3-4 times a day.  Do not touch or rub your eyes.  Do not wear contact lenses until the inflammation is gone. Wear glasses instead.  Do not wear eye makeup until the inflammation is gone.  Keep all follow-up visits as told by your health care provider. This is important. Contact a health care provider if:  Your symptoms  get worse or do not improve with treatment.  You have mild eye pain.  You have sensitivity to light.  You have spots or blisters on your eyes.  You have pus draining from your eye.  You have a fever. Get help right away if:  You have redness, swelling, or other symptoms in only one eye.  Your vision is blurred or you have vision  changes.  You have severe eye pain. This information is not intended to replace advice given to you by your health care provider. Make sure you discuss any questions you have with your health care provider. Document Released: 12/17/2002 Document Revised: 05/25/2016 Document Reviewed: 04/08/2016 Elsevier Interactive Patient Education  2018 [employer].

## 2018-02-22 NOTE — Telephone Encounter (Signed)
Vm left with information. W.Lassie Demorest, CCMA

## 2018-02-22 NOTE — Telephone Encounter (Signed)
Patient needs to be seen for prescription medication She can try over-the-counter Zaditor eye drops in the meantime

## 2018-02-23 NOTE — Progress Notes (Signed)
Mary Clarke is a 47 y.o. female who presents to [name] today for right pink eye.   Laverda notes right conjunctiva injection, discharge, and slight foreign body sensation in her right eye present for about 1 day. She notes this occurs with allergy symptoms including runny nose, congestion and sneezing. She denies any fever, chills, NVD. She denies and eye injury, blurry vision or severe pain. She feels well otherwise.    ROS as above:  Exam:  BP 135/78   Pulse 98   Ht 5' 2.5" (1.588 m)   Wt 239 lb (108.4 kg)   BMI 43.02 kg/m  Gen: Well NAD HEENT: EOMI,  MMM right eye conjunctiva injection. Normal pupillary response and normal fundoscopic exam.  Left eye is normal appearing. Mild nasal turbinate inflammation. No cervical LAD.  Lungs: Normal work of breathing. CTABL Heart: RRR no MRG Abd: NABS, Soft. Nondistended, Nontender Exts: Brisk capillary refill, warm and well perfused.     Assessment and Plan: 47 y.o. female with right eye conjunctivitis, ;likely allergic, however viral is a possibility. Doubtful for bacterial conjunctivitis.  Additionally doubtful for more serious etiology as patient denies pain blurriness or photophobia.  Plan for treatment with topical antihistamines, continued oral antihistamines,.  Back-up plan for antibiotic eyedrops printed.  Patient will fill and take if worse   No orders of the defined types were placed in this encounter.  Meds ordered this encounter  Medications  . trimethoprim-polymyxin b (POLYTRIM) ophthalmic solution    Sig: Place 1 drop into the right eye every 4 (four) hours.    Dispense:  10 mL    Refill:  0     Historical information moved to improve visibility of documentation.  Past Medical History:  Diagnosis Date  . Allergy   . Anxiety   . Chronic bronchitis ([facility])    per pt stopped when I quit smoking    . Depression   . Diabetes mellitus without complication ([facility])   . Elevated calcitonin level 08/15/2017  . Heart murmur   . Hyperlipidemia   . Hypertension   . PCOS (polycystic ovarian syndrome)   . Severe mitral regurgitation by prior echocardiogram 01/04/2017  . Sleep apnea    Past Surgical History:  Procedure Laterality Date  . CHOLECYSTECTOMY    . RIGHT/LEFT HEART CATH AND CORONARY ANGIOGRAPHY Right 04/07/2017   Procedure: Right/Left Heart Cath and Coronary Angiography;  Surgeon: Lyn Records, MD;  Location: [facility] INVASIVE CV LAB;  Service: Cardiovascular;  Laterality: Right;  . TEE WITHOUT CARDIOVERSION N/A 04/07/2017   Procedure: TRANSESOPHAGEAL ECHOCARDIOGRAM (TEE);  Surgeon: Lewayne Bunting, MD;  Location: [facility] ENDOSCOPY;  Service: Cardiovascular;  Laterality: N/A;   Social History   Tobacco Use  . Smoking status: Former Smoker    Packs/day: 1.00    Years: 15.00    Pack years: 15.00    Types: Cigarettes    Last attempt to quit: 06/11/2015    Years since quitting: 2.7  . Smokeless tobacco: Never Used  . Tobacco comment: 2nd hand smoke exposure current  Substance Use Topics  . Alcohol use: No   family history includes Aneurysm in her brother; Diabetes in her brother and brother; Heart attack (age of onset: 32) in her brother; Heart attack (age of onset: 25) in her father; Heart attack (age of onset: 77) in her mother; Hypertension in her brother and mother; Lung cancer in her brother; Stroke in her brother; Thyroid  disease in her brother.  Medications: Current Outpatient Medications  Medication Sig Dispense Refill  . aspirin 81 MG chewable tablet Chew 81 mg by mouth once.    Marland Kitchen atorvastatin (LIPITOR) 40 MG tablet Take 1 tablet (40 mg total) by mouth at bedtime. 90 tablet 1  . Evolocumab (REPATHA SURECLICK) 140 MG/ML SOAJ Inject 140 mg into the skin every 14 (fourteen) days. 2 pen 12  . fluticasone (FLONASE) 50 MCG/ACT nasal spray Place 1 spray into both nostrils daily as  needed for allergies or rhinitis.    Marland Kitchen levocetirizine (XYZAL) 5 MG tablet Take 1 tablet by mouth daily as needed (allergies).     . losartan (COZAAR) 100 MG tablet Take 1 tablet (100 mg total) by mouth daily. 90 tablet 2  . metFORMIN (GLUCOPHAGE) 1000 MG tablet Take 1 tablet (1,000 mg total) by mouth 2 (two) times daily with a meal. 180 tablet 0  . Multiple Vitamin (MULTIVITAMIN) tablet Take 1 tablet by mouth daily.    . nortriptyline (PAMELOR) 10 MG capsule Take 3 capsules by mouth at bedtime.    Marland Kitchen trimethoprim-polymyxin b (POLYTRIM) ophthalmic solution Place 1 drop into the right eye every 4 (four) hours. 10 mL 0   No current facility-administered medications for this visit.    No Known Allergies  Health Maintenance Health Maintenance  Topic Date Due  . FOOT EXAM  12/12/2017  . MAMMOGRAM  01/08/2018  . HEMOGLOBIN A1C  02/01/2018  . OPHTHALMOLOGY EXAM  08/08/2018 (Originally 07/27/2018)  . INFLUENZA VACCINE  05/10/2018  . PAP SMEAR  12/09/2018  . PNEUMOCOCCAL POLYSACCHARIDE VACCINE (2) 12/12/2021  . TETANUS/TDAP  12/13/2026    Discussed warning signs or symptoms. Please see discharge instructions. Patient expresses understanding.

## 2018-02-26 ENCOUNTER — Other Ambulatory Visit: Payer: Self-pay | Admitting: Physician Assistant

## 2018-02-26 DIAGNOSIS — E119 Type 2 diabetes mellitus without complications: Secondary | ICD-10-CM

## 2018-02-28 ENCOUNTER — Encounter: Payer: Self-pay | Admitting: Physician Assistant

## 2018-02-28 ENCOUNTER — Ambulatory Visit: Payer: BLUE CROSS/BLUE SHIELD | Admitting: Physician Assistant

## 2018-02-28 VITALS — BP 133/84 | HR 96 | Temp 98.5°F | Ht 62.52 in | Wt 241.0 lb

## 2018-02-28 DIAGNOSIS — B309 Viral conjunctivitis, unspecified: Secondary | ICD-10-CM | POA: Diagnosis not present

## 2018-02-28 MED ORDER — ERYTHROMYCIN 5 MG/GM OP OINT: 1.0000 "application " | TOPICAL_OINTMENT | Freq: Every day | OPHTHALMIC | 0 refills | Status: DC

## 2018-02-28 NOTE — Patient Instructions (Addendum)
- continue Zaditor eye drops and Benadryl - buy some preservative-free artificial tears and keep them refrigerated. Follow instructions on the package - start Erythromycin antibiotic ointment at bedtime - cool compresses x 20 minutes every 3-4 hours  - follow-up with eye doctor tomorrow  [facility] [place] [address] [place], [place] [phone]  Viral Conjunctivitis, Adult Viral conjunctivitis is an inflammation of the clear membrane that covers the white part of your eye and the inner surface of your eyelid (conjunctiva). The inflammation is caused by a viral infection. The blood vessels in the conjunctiva become inflamed, causing the eye to become red or pink, and often itchy. Viral conjunctivitis can be easily passed from one person to another (is contagious). This condition is often called pink eye. What are the causes? This condition is caused by a virus. A virus is a type of contagious germ. It can be spread by touching objects that have been contaminated with the virus, such as doorknobs or towels. It can also be passed through droplets, such as from coughing or sneezing. What are the signs or symptoms? Symptoms of this condition include:  Eye redness.  Tearing or watery eyes.  Itchy and irritated eyes.  Burning feeling in the eyes.  Clear drainage from the eye.  Swollen eyelids.  A gritty feeling in the eye.  Light sensitivity.  This condition often occurs with other symptoms, such as a fever, nausea, or a rash. How is this diagnosed? This condition is diagnosed with a medical history and physical exam. If you have discharge from your eye, the discharge may be tested to rule out other causes of conjunctivitis. How is this treated? Viral conjunctivitis does not respond to medicines that kill bacteria (antibiotics). Treatment for viral conjunctivitis is directed at stopping a bacterial infection from developing in addition to the viral  infection. Treatment also aims to relieve your symptoms, such as itching. This may be done with antihistamine drops or other eye medicines. Rarely, steroid eye drops or antiviral medicines may be prescribed. Follow these instructions at home: Medicines   Take or apply over-the-counter and prescription medicines only as told by your health care provider.  Be very careful to avoid touching the edge of the eyelid with the eye drop bottle or ointment tube when applying medicines to the affected eye. Being careful this way will stop you from spreading the infection to the other eye or to other people. Eye care  Avoid touching or rubbing your eyes.  Apply a warm, wet, clean washcloth to your eye for 10-20 minutes, 3-4 times per day or as told by your health care provider.  If you wear contact lenses, do not wear them until the inflammation is gone and your health care provider says it is safe to wear them again. Ask your health care provider how to sterilize or replace your contact lenses before using them again. Wear glasses until you can resume wearing contacts.  Avoid wearing eye makeup until the inflammation is gone. Throw away any old eye cosmetics that may be contaminated.  Gently wipe away any drainage from your eye with a warm, wet washcloth or a cotton ball. General instructions  Change or wash your pillowcase every day or as told by your health care provider.  Do not share towels, pillowcases, washcloths, eye makeup, makeup brushes, contact lenses, or glasses. This may spread the infection.  Wash your hands often with soap and water. Use paper towels to dry your hands.  If soap and water are not available, use hand sanitizer.  Try to avoid contact with other people for one week or as told by your health care provider. Contact a health care provider if:  Your symptoms do not improve with treatment or they get worse.  You have increased pain.  Your vision becomes blurry.  You  have a fever.  You have facial pain, redness, or swelling.  You have yellow or green drainage coming from your eye.  You have new symptoms. This information is not intended to replace advice given to you by your health care provider. Make sure you discuss any questions you have with your health care provider. Document Released: 12/17/2002 Document Revised: 04/23/2016 Document Reviewed: 04/12/2016 Elsevier Interactive Patient Education  [employer].

## 2018-02-28 NOTE — Progress Notes (Signed)
HPI:                                                                Mary Clarke is a 47 y.o. female who presents to [name]: Primary Care Sports Medicine today for bilateral eye redness and pain  Reports sudden onset red eye redness, itching, and discomfort beginning 6 days ago. She was diagnosed with allergic conjunctivitis and prescribed Zaditor, antihistamine and Polytrim prn if symptoms worsen. She started the Polytrim a few days ago and felt like it made symptoms worse. Symptoms have now spread to her left eye and she is having severe pain, photophobia, eyelid swelling, discharge and epiphora. Denies fever, chills, diplopia, vision loss. Does not wear contact lenses.   Past Medical History:  Diagnosis Date  . Allergy   . Anxiety   . Chronic bronchitis (HCC)    per pt stopped when I quit smoking  . Depression   . Diabetes mellitus without complication (HCC)   . Elevated calcitonin level 08/15/2017  . Heart murmur   . Hyperlipidemia   . Hypertension   . PCOS (polycystic ovarian syndrome)   . Severe mitral regurgitation by prior echocardiogram 01/04/2017  . Sleep apnea    Past Surgical History:  Procedure Laterality Date  . CHOLECYSTECTOMY    . RIGHT/LEFT HEART CATH AND CORONARY ANGIOGRAPHY Right 04/07/2017   Procedure: Right/Left Heart Cath and Coronary Angiography;  Surgeon: Lyn Records, MD;  Location: [facility] INVASIVE CV LAB;  Service: Cardiovascular;  Laterality: Right;  . TEE WITHOUT CARDIOVERSION N/A 04/07/2017   Procedure: TRANSESOPHAGEAL ECHOCARDIOGRAM (TEE);  Surgeon: Lewayne Bunting, MD;  Location: [facility] ENDOSCOPY;  Service: Cardiovascular;  Laterality: N/A;   Social History   Tobacco Use  . Smoking status: Former Smoker    Packs/day: 1.00    Years: 15.00    Pack years: 15.00    Types: Cigarettes    Last attempt to quit: 06/11/2015    Years since quitting: 2.7  . Smokeless tobacco: Never Used  . Tobacco comment: 2nd hand smoke exposure current    Substance Use Topics  . Alcohol use: No   family history includes Aneurysm in her brother; Diabetes in her brother and brother; Heart attack (age of onset: 25) in her brother; Heart attack (age of onset: 78) in her father; Heart attack (age of onset: 60) in her mother; Hypertension in her brother and mother; Lung cancer in her brother; Stroke in her brother; Thyroid disease in her brother.    ROS: negative except as noted in the HPI  Medications: Current Outpatient Medications  Medication Sig Dispense Refill  . aspirin 81 MG chewable tablet Chew 81 mg by mouth once.    Marland Kitchen atorvastatin (LIPITOR) 40 MG tablet Take 1 tablet (40 mg total) by mouth at bedtime. 90 tablet 1  . Evolocumab (REPATHA SURECLICK) 140 MG/ML SOAJ Inject 140 mg into the skin every 14 (fourteen) days. 2 pen 12  . fluticasone (FLONASE) 50 MCG/ACT nasal spray Place 1 spray into both nostrils daily as needed for allergies or rhinitis.    Marland Kitchen levocetirizine (XYZAL) 5 MG tablet Take 1 tablet by mouth daily as needed (allergies).     . losartan (COZAAR) 100 MG tablet Take 1 tablet (100 mg total) by mouth  daily. 90 tablet 2  . metFORMIN (GLUCOPHAGE) 1000 MG tablet Take 1 tablet (1,000 mg total) by mouth 2 (two) times daily with a meal. Due for follow up visit 60 tablet 0  . Multiple Vitamin (MULTIVITAMIN) tablet Take 1 tablet by mouth daily.    . nortriptyline (PAMELOR) 10 MG capsule Take 3 capsules by mouth at bedtime.    Marland Kitchen erythromycin [facility]) ophthalmic ointment Place 1 application into both eyes at bedtime. 3.5 g 0   No current facility-administered medications for this visit.    No Known Allergies     Objective:  BP 133/84   Pulse 96   Temp 98.5 F (36.9 C)   Ht 5' 2.52" (1.588 m)   Wt 241 lb (109.3 kg)   SpO2 96%   BMI 43.35 kg/m  Gen:  alert,  ill-appearing, crying, no acute distress, appropriate for age HEENT: bilateral scleral injection and eyelid edema, there is mucopurulent discharge of the medial  canthus of the right eye, epiphora of the left eye, photophobia on exam, PERRL, EOM's intact, visual acuity with correction - OD 20/70, OS 20/50, bilateral 20/50     Pulm: Normal work of breathing, normal phonation Neuro: alert and oriented x 3, no tremor MSK: extremities atraumatic, normal gait and station Skin: intact, no rashes on exposed skin, no jaundice, no cyanosis Psych: well-groomed, cooperative, good eye contact, euthymic mood, affect mood-congruent, speech is articulate, and thought processes clear and goal-directed    No results found for this or any previous visit (from the past 72 hour(s)). No results found.    Assessment and Plan: 47 y.o. female with   Viral conjunctivitis of both eyes - Plan: Ambulatory referral to Ophthalmology, erythromycin [facility]) ophthalmic ointment - decreased visual acuity w/correction bilaterally, significant injection and discomfort. Urgent referral placed to Ophthalmology  - continue Zaditor eye drops and Benadryl - buy some preservative-free artificial tears and keep them refrigerated. Follow instructions on the package - start Erythromycin antibiotic ointment at bedtime - cool compresses x 20 minutes every 3-4 hours  - follow-up with eye doctor tomorrow    Patient education and anticipatory guidance given Patient agrees with treatment plan Follow-up as needed if symptoms worsen or fail to improve  Levonne Hubert PA-C

## 2018-03-01 ENCOUNTER — Encounter: Payer: Self-pay | Admitting: Physician Assistant

## 2018-03-01 DIAGNOSIS — H109 Unspecified conjunctivitis: Secondary | ICD-10-CM | POA: Diagnosis not present

## 2018-03-01 DIAGNOSIS — H1859 Other hereditary corneal dystrophies: Secondary | ICD-10-CM | POA: Diagnosis not present

## 2018-03-01 LAB — HM DIABETES EYE EXAM

## 2018-03-02 ENCOUNTER — Telehealth: Payer: Self-pay

## 2018-03-06 ENCOUNTER — Telehealth: Payer: Self-pay | Admitting: Physician Assistant

## 2018-03-06 DIAGNOSIS — H1033 Unspecified acute conjunctivitis, bilateral: Secondary | ICD-10-CM | POA: Diagnosis not present

## 2018-03-06 LAB — HM DIABETES EYE EXAM

## 2018-03-06 NOTE — Telephone Encounter (Signed)
FMLA forms completed and faxed [place], can you confirm that the fax went through?

## 2018-03-06 NOTE — Telephone Encounter (Signed)
No additional encounter notes found. W.Kayleah Appleyard, CCMA 

## 2018-03-08 DIAGNOSIS — H1033 Unspecified acute conjunctivitis, bilateral: Secondary | ICD-10-CM | POA: Diagnosis not present

## 2018-03-15 ENCOUNTER — Encounter: Payer: Self-pay | Admitting: Physician Assistant

## 2018-03-15 DIAGNOSIS — H1033 Unspecified acute conjunctivitis, bilateral: Secondary | ICD-10-CM | POA: Diagnosis not present

## 2018-03-15 DIAGNOSIS — Z9289 Personal history of other medical treatment: Secondary | ICD-10-CM | POA: Insufficient documentation

## 2018-03-15 LAB — HM DIABETES EYE EXAM

## 2018-03-15 NOTE — Progress Notes (Signed)
Hi Mary Clarke,  Your heart monitor was normal. This means your dizziness / near passing out symptoms are not due to a heart rhythm problem.  [name]

## 2018-03-30 ENCOUNTER — Telehealth: Payer: Self-pay | Admitting: Pharmacist

## 2018-03-30 DIAGNOSIS — E785 Hyperlipidemia, unspecified: Secondary | ICD-10-CM

## 2018-03-30 NOTE — Telephone Encounter (Signed)
Patient on her 5th dose of Repatha. Due to repeat Lipid panel.  Orders mailed to patient for fasting lipid profile and lfts ASAP.

## 2018-04-02 ENCOUNTER — Other Ambulatory Visit: Payer: Self-pay | Admitting: Physician Assistant

## 2018-04-02 DIAGNOSIS — E119 Type 2 diabetes mellitus without complications: Secondary | ICD-10-CM

## 2018-04-05 ENCOUNTER — Ambulatory Visit (INDEPENDENT_AMBULATORY_CARE_PROVIDER_SITE_OTHER): Payer: BLUE CROSS/BLUE SHIELD

## 2018-04-05 ENCOUNTER — Encounter: Payer: Self-pay | Admitting: Physician Assistant

## 2018-04-05 ENCOUNTER — Ambulatory Visit (INDEPENDENT_AMBULATORY_CARE_PROVIDER_SITE_OTHER): Payer: BLUE CROSS/BLUE SHIELD | Admitting: Physician Assistant

## 2018-04-05 VITALS — BP 143/84 | HR 90 | Resp 14 | Wt 241.0 lb

## 2018-04-05 DIAGNOSIS — M25441 Effusion, right hand: Secondary | ICD-10-CM

## 2018-04-05 DIAGNOSIS — F4321 Adjustment disorder with depressed mood: Secondary | ICD-10-CM

## 2018-04-05 DIAGNOSIS — M79642 Pain in left hand: Secondary | ICD-10-CM | POA: Diagnosis not present

## 2018-04-05 DIAGNOSIS — E1169 Type 2 diabetes mellitus with other specified complication: Secondary | ICD-10-CM

## 2018-04-05 DIAGNOSIS — I10 Essential (primary) hypertension: Secondary | ICD-10-CM | POA: Diagnosis not present

## 2018-04-05 DIAGNOSIS — E119 Type 2 diabetes mellitus without complications: Secondary | ICD-10-CM | POA: Diagnosis not present

## 2018-04-05 DIAGNOSIS — M7989 Other specified soft tissue disorders: Secondary | ICD-10-CM | POA: Diagnosis not present

## 2018-04-05 DIAGNOSIS — M79641 Pain in right hand: Secondary | ICD-10-CM | POA: Diagnosis not present

## 2018-04-05 DIAGNOSIS — F4381 Prolonged grief disorder: Secondary | ICD-10-CM

## 2018-04-05 DIAGNOSIS — Z79899 Other long term (current) drug therapy: Secondary | ICD-10-CM | POA: Diagnosis not present

## 2018-04-05 DIAGNOSIS — Z1239 Encounter for other screening for malignant neoplasm of breast: Secondary | ICD-10-CM

## 2018-04-05 DIAGNOSIS — E781 Pure hyperglyceridemia: Secondary | ICD-10-CM

## 2018-04-05 DIAGNOSIS — Z1231 Encounter for screening mammogram for malignant neoplasm of breast: Secondary | ICD-10-CM

## 2018-04-05 DIAGNOSIS — F4329 Adjustment disorder with other symptoms: Secondary | ICD-10-CM

## 2018-04-05 DIAGNOSIS — E785 Hyperlipidemia, unspecified: Secondary | ICD-10-CM

## 2018-04-05 DIAGNOSIS — M19042 Primary osteoarthritis, left hand: Secondary | ICD-10-CM | POA: Diagnosis not present

## 2018-04-05 DIAGNOSIS — M25442 Effusion, left hand: Secondary | ICD-10-CM

## 2018-04-05 DIAGNOSIS — Z780 Asymptomatic menopausal state: Secondary | ICD-10-CM

## 2018-04-05 LAB — HEPATIC FUNCTION PANEL
AG Ratio: 1.2 (calc) (ref 1.0–2.5)
ALKALINE PHOSPHATASE (APISO): 72 U/L (ref 33–115)
ALT: 33 U/L — AB (ref 6–29)
AST: 27 U/L (ref 10–35)
Albumin: 4.2 g/dL (ref 3.6–5.1)
BILIRUBIN INDIRECT: 0.3 mg/dL (ref 0.2–1.2)
Bilirubin, Direct: 0.1 mg/dL (ref 0.0–0.2)
GLOBULIN: 3.4 g/dL (ref 1.9–3.7)
TOTAL PROTEIN: 7.6 g/dL (ref 6.1–8.1)
Total Bilirubin: 0.4 mg/dL (ref 0.2–1.2)

## 2018-04-05 LAB — LIPID PANEL
Cholesterol: 144 mg/dL (ref <200)
HDL: 24 mg/dL — ABNORMAL LOW (ref >50)
NON-HDL CHOLESTEROL (CALC): 120 mg/dL (ref <130)
Total CHOL/HDL Ratio: 6 (calc) — ABNORMAL HIGH (ref <5.0)
Triglycerides: 975 mg/dL — ABNORMAL HIGH (ref <150)

## 2018-04-05 LAB — POCT GLYCOSYLATED HEMOGLOBIN (HGB A1C): HbA1c, POC (controlled diabetic range): 6.8 % (ref 0.0–7.0)

## 2018-04-05 MED ORDER — ATORVASTATIN CALCIUM 40 MG PO TABS: 40.0000 mg | ORAL_TABLET | Freq: Every day | ORAL | 1 refills | Status: DC

## 2018-04-05 MED ORDER — METFORMIN HCL 1000 MG PO TABS: 1000.0000 mg | ORAL_TABLET | Freq: Two times a day (BID) | ORAL | 1 refills | Status: DC

## 2018-04-05 MED ORDER — MELOXICAM 15 MG PO TABS: 15.0000 mg | ORAL_TABLET | Freq: Every day | ORAL | 0 refills | Status: DC

## 2018-04-05 MED ORDER — LOSARTAN POTASSIUM 100 MG PO TABS: 100.0000 mg | ORAL_TABLET | Freq: Every day | ORAL | 1 refills | Status: DC

## 2018-04-05 NOTE — Patient Instructions (Addendum)
Diabetes Preventive Care: - annual foot exam  - annual dilated eye exam with an eye doctor - self foot exams at least weekly - pneumonia vaccine once (booster in 5 years and at age 47) - annual influenza vaccine - twice yearly dental cleanings and yearly exam - goal blood pressure <140/90, ideally <130/80 - LDL cholesterol <70 - A1C <4.5 - body mass index (BMI) <25.0 - follow-up every 3 months if your A1C is not at goal - follow-up every 6 months if diabetes is well controlled    Carbohydrate Counting for Diabetes Mellitus, Adult Carbohydrate counting is a method for keeping track of how many carbohydrates you eat. Eating carbohydrates naturally increases the amount of sugar (glucose) in the blood. Counting how many carbohydrates you eat helps keep your blood glucose within normal limits, which helps you manage your diabetes (diabetes mellitus). It is important to know how many carbohydrates you can safely have in each meal. This is different for every person. A diet and nutrition specialist (registered dietitian) can help you make a meal plan and calculate how many carbohydrates you should have at each meal and snack. Carbohydrates are found in the following foods:  Grains, such as breads and cereals.  Dried beans and soy products.  Starchy vegetables, such as potatoes, peas, and corn.  Fruit and fruit juices.  Milk and yogurt.  Sweets and snack foods, such as cake, cookies, candy, chips, and soft drinks.  How do I count carbohydrates? There are two ways to count carbohydrates in food. You can use either of the methods or a combination of both. Reading "Nutrition Facts" on packaged food The "Nutrition Facts" list is included on the labels of almost all packaged foods and beverages in the [place] It includes:  The serving size.  Information about nutrients in each serving, including the grams (g) of carbohydrate per serving.  To use the "Nutrition Facts":  Decide how many  servings you will have.  Multiply the number of servings by the number of carbohydrates per serving.  The resulting number is the total amount of carbohydrates that you will be having.  Learning standard serving sizes of other foods When you eat foods containing carbohydrates that are not packaged or do not include "Nutrition Facts" on the label, you need to measure the servings in order to count the amount of carbohydrates:  Measure the foods that you will eat with a food scale or measuring cup, if needed.  Decide how many standard-size servings you will eat.  Multiply the number of servings by 15. Most carbohydrate-rich foods have about 15 g of carbohydrates per serving. ? For example, if you eat 8 oz (170 g) of strawberries, you will have eaten 2 servings and 30 g of carbohydrates (2 servings x 15 g = 30 g).  For foods that have more than one food mixed, such as soups and casseroles, you must count the carbohydrates in each food that is included.  The following list contains standard serving sizes of common carbohydrate-rich foods. Each of these servings has about 15 g of carbohydrates:   hamburger bun or  English muffin.   oz (15 mL) syrup.   oz (14 g) jelly.  1 slice of bread.  1 six-inch tortilla.  3 oz (85 g) cooked rice or pasta.  4 oz (113 g) cooked dried beans.  4 oz (113 g) starchy vegetable, such as peas, corn, or potatoes.  4 oz (113 g) hot cereal.  4 oz (113 g) mashed  potatoes or  of a large baked potato.  4 oz (113 g) canned or frozen fruit.  4 oz (120 mL) fruit juice.  4-6 crackers.  6 chicken nuggets.  6 oz (170 g) unsweetened dry cereal.  6 oz (170 g) plain fat-free yogurt or yogurt sweetened with artificial sweeteners.  8 oz (240 mL) milk.  8 oz (170 g) fresh fruit or one small piece of fruit.  24 oz (680 g) popped popcorn.  Example of carbohydrate counting Sample meal  3 oz (85 g) chicken breast.  6 oz (170 g) brown rice.  4 oz  (113 g) corn.  8 oz (240 mL) milk.  8 oz (170 g) strawberries with sugar-free whipped topping. Carbohydrate calculation 1. Identify the foods that contain carbohydrates: ? Rice. ? Corn. ? Milk. ? Strawberries. 2. Calculate how many servings you have of each food: ? 2 servings rice. ? 1 serving corn. ? 1 serving milk. ? 1 serving strawberries. 3. Multiply each number of servings by 15 g: ? 2 servings rice x 15 g = 30 g. ? 1 serving corn x 15 g = 15 g. ? 1 serving milk x 15 g = 15 g. ? 1 serving strawberries x 15 g = 15 g. 4. Add together all of the amounts to find the total grams of carbohydrates eaten: ? 30 g + 15 g + 15 g + 15 g = 75 g of carbohydrates total. This information is not intended to replace advice given to you by your health care provider. Make sure you discuss any questions you have with your health care provider. Document Released: 09/26/2005 Document Revised: 04/15/2016 Document Reviewed: 03/09/2016 Elsevier Interactive Patient Education  2018 [employer]    Osteoarthritis Osteoarthritis is a type of arthritis that affects tissue that covers the ends of bones in joints (cartilage). Cartilage acts as a cushion between the bones and helps them move smoothly. Osteoarthritis results when cartilage in the joints gets worn down. Osteoarthritis is sometimes called "wear and tear" arthritis. Osteoarthritis is the most common form of arthritis. It often occurs in older people. It is a condition that gets worse over time (a progressive condition). Joints that are most often affected by this condition are in:  Fingers.  Toes.  Hips.  Knees.  Spine, including neck and lower back.  What are the causes? This condition is caused by age-related wearing down of cartilage that covers the ends of bones. What increases the risk? The following factors may make you more likely to develop this condition:  Older age.  Being overweight or obese.  Overuse of joints, such  as in athletes.  Past injury of a joint.  Past surgery on a joint.  Family history of osteoarthritis.  What are the signs or symptoms? The main symptoms of this condition are pain, swelling, and stiffness in the joint. The joint may lose its shape over time. Small pieces of bone or cartilage may break off and float inside of the joint, which may cause more pain and damage to the joint. Small deposits of bone (osteophytes) may grow on the edges of the joint. Other symptoms may include:  A grating or scraping feeling inside the joint when you move it.  Popping or creaking sounds when you move.  Symptoms may affect one or more joints. Osteoarthritis in a major joint, such as your knee or hip, can make it painful to walk or exercise. If you have osteoarthritis in your hands, you might not be  able to grip items, twist your hand, or control small movements of your hands and fingers (fine motor skills). How is this diagnosed? This condition may be diagnosed based on:  Your medical history.  A physical exam.  Your symptoms.  X-rays of the affected joint(s).  Blood tests to rule out other types of arthritis.  How is this treated? There is no cure for this condition, but treatment can help to control pain and improve joint function. Treatment plans may include:  A prescribed exercise program that allows for rest and joint relief. You may work with a physical therapist.  A weight control plan.  Pain relief techniques, such as: ? Applying heat and cold to the joint. ? Electric pulses delivered to nerve endings under the skin (transcutaneous electrical nerve stimulation, or TENS). ? Massage. ? Certain nutritional supplements.  NSAIDs or prescription medicines to help relieve pain.  Medicine to help relieve pain and inflammation (corticosteroids). This can be given by mouth (orally) or as an injection.  Assistive devices, such as a brace, wrap, splint, specialized glove, or  cane.  Surgery, such as: ? An osteotomy. This is done to reposition the bones and relieve pain or to remove loose pieces of bone and cartilage. ? Joint replacement surgery. You may need this surgery if you have very bad (advanced) osteoarthritis.  Follow these instructions at home: Activity  Rest your affected joints as directed by your health care provider.  Do not drive or use heavy machinery while taking prescription pain medicine.  Exercise as directed. Your health care provider or physical therapist may recommend specific types of exercise, such as: ? Strengthening exercises. These are done to strengthen the muscles that support joints that are affected by arthritis. They can be performed with weights or with exercise bands to add resistance. ? Aerobic activities. These are exercises, such as brisk walking or water aerobics, that get your heart pumping. ? Range-of-motion activities. These keep your joints easy to move. ? Balance and agility exercises. Managing pain, stiffness, and swelling  If directed, apply heat to the affected area as often as told by your health care provider. Use the heat source that your health care provider recommends, such as a moist heat pack or a heating pad. ? If you have a removable assistive device, remove it as told by your health care provider. ? Place a towel between your skin and the heat source. If your health care provider tells you to keep the assistive device on while you apply heat, place a towel between the assistive device and the heat source. ? Leave the heat on for 20-30 minutes. ? Remove the heat if your skin turns bright red. This is especially important if you are unable to feel pain, heat, or cold. You may have a greater risk of getting burned.  If directed, put ice on the affected joint: ? If you have a removable assistive device, remove it as told by your health care provider. ? Put ice in a plastic bag. ? Place a towel between your  skin and the bag. If your health care provider tells you to keep the assistive device on during icing, place a towel between the assistive device and the bag. ? Leave the ice on for 20 minutes, 2-3 times a day. General instructions  Take over-the-counter and prescription medicines only as told by your health care provider.  Maintain a healthy weight. Follow instructions from your health care provider for weight control. These may include dietary  restrictions.  Do not use any products that contain nicotine or tobacco, such as cigarettes and e-cigarettes. These can delay bone healing. If you need help quitting, ask your health care provider.  Use assistive devices as directed by your health care provider.  Keep all follow-up visits as told by your health care provider. This is important. Where to find more information:  [employer] of Arthritis and Musculoskeletal and Skin Diseases: www.niams.http://www.myers.net/  [employer] on Aging: https://walker.com/  American College of Rheumatology: www.rheumatology.org Contact a health care provider if:  Your skin turns red.  You develop a rash.  You have pain that gets worse.  You have a fever along with joint or muscle aches. Get help right away if:  You lose a lot of weight.  You suddenly lose your appetite.  You have night sweats. Summary  Osteoarthritis is a type of arthritis that affects tissue covering the ends of bones in joints (cartilage).  This condition is caused by age-related wearing down of cartilage that covers the ends of bones.  The main symptom of this condition is pain, swelling, and stiffness in the joint.  There is no cure for this condition, but treatment can help to control pain and improve joint function. This information is not intended to replace advice given to you by your health care provider. Make sure you discuss any questions you have with your health care provider. Document Released: 09/26/2005  Document Revised: 05/30/2016 Document Reviewed: 05/30/2016 Elsevier Interactive Patient Education  [employer].

## 2018-04-05 NOTE — Progress Notes (Signed)
HPI:                                                                Mary MajorLisa Clarke is a 47 y.o. female who [facility] today for diabetes follow-up  DMII: taking Metformin 1000 mg bid. Compliant with medications.  Denies polydipsia, polyuria, polyphagia. Denies blurred vision or vision change. Denies extremity pain, altered sensation and paresthesias.  Denies ulcers/wounds on feet. Hx of DKA/HHS: never Diabetes associated symptoms: chronic sinusitis, obesity Glucometer: n/a Blood glucose readings: n/a Hypoglycemia frequency: never Severe hypoglycemia (requiring 3rd party assistance): never  Grief: In 2017, Mary StanleyLisa lost 2 brothers and best friend suddenly to health issues. She states she had a lot of stressful life events going on at the time and did not have a chance to grieve. Has recently been ruminating and thinking a lot about these losses. Feels depressed and hopeless most days. Feels lonely and isolated from sisters, who do not make time for her. She currently takes Nortriptyline 30 mg at bedtime for facial pain/?TN, but does not feel like this helps with her mood at all.  Hand pain: Also c/o 1 month of bilateral hand pain and swelling, gradually worsening, affecting the joints of the index and middle fingers primarily. Endorses stiffness, worse at night, and decreased grip strength. No known injury or trauma. She is left-handed. Currently works full-time as a Conservation officer, naturecashier at [employer]. Has not tried any treatments.   [facility] 04/05/2018 08/03/2017 11/17/2016  Decreased Interest 0 1 1  Down, Depressed, Hopeless 2 2 2   PHQ - 2 Score 2 3 3   Altered sleeping 3 2 3   Tired, decreased energy 3 3 3   Change in appetite 0 1 0  Feeling bad or failure about yourself  0 0 0  Trouble concentrating 0 1 1  Moving slowly or fidgety/restless 2 0 0  Suicidal thoughts 0 0 0  PHQ-9 Score 10 10 10   Difficult doing work/chores Very  difficult - -    GAD 7 : Generalized Anxiety Score 04/05/2018 11/17/2016  Nervous, Anxious, on Edge 1 1  Control/stop worrying 0 2  Worry too much - different things 0 2  Trouble relaxing 3 3  Restless 2 0  Easily annoyed or irritable 1 3  Afraid - awful might happen 0 0  Total GAD 7 Score 7 11  Anxiety Difficulty Very difficult -      Past Medical History:  Diagnosis Date  . Allergy   . Anxiety   . Chronic bronchitis (HCC)    per pt stopped when I quit smoking  . Depression   . Diabetes mellitus without complication (HCC)   . Elevated calcitonin level 08/15/2017  . Heart murmur   . Hyperlipidemia   . Hypertension   . PCOS (polycystic ovarian syndrome)   . Severe mitral regurgitation by prior echocardiogram 01/04/2017  . Sleep apnea    Past Surgical History:  Procedure Laterality Date  . CHOLECYSTECTOMY    . RIGHT/LEFT HEART CATH AND CORONARY ANGIOGRAPHY Right 04/07/2017   Procedure: Right/Left Heart Cath and Coronary Angiography;  Surgeon: Lyn RecordsSmith, Henry W, MD;  [facility];  Service: Cardiovascular;  Laterality: Right;  . TEE WITHOUT CARDIOVERSION N/A  04/07/2017   Procedure: TRANSESOPHAGEAL ECHOCARDIOGRAM (TEE);  Surgeon: Lewayne Bunting, MD;  Location: [facility] ENDOSCOPY;  Service: Cardiovascular;  Laterality: N/A;   Social History   Tobacco Use  . Smoking status: Former Smoker    Packs/day: 1.00    Years: 15.00    Pack years: 15.00    Types: Cigarettes    Last attempt to quit: 06/11/2015    Years since quitting: 2.8  . Smokeless tobacco: Never Used  . Tobacco comment: 2nd hand smoke exposure current  Substance Use Topics  . Alcohol use: No   family history includes Aneurysm in her brother; Diabetes in her brother and brother; Heart attack (age of onset: 11) in her brother; Heart attack (age of onset: 50) in her father; Heart attack (age of onset: 21) in her mother; Hypertension in her brother and mother; Lung cancer in her brother; Stroke in her brother;  Thyroid disease in her brother.    ROS: negative except as noted in the HPI  Medications: Current Outpatient Medications  Medication Sig Dispense Refill  . aspirin 81 MG chewable tablet Chew 81 mg by mouth once.    Marland Kitchen atorvastatin (LIPITOR) 40 MG tablet Take 1 tablet (40 mg total) by mouth at bedtime. 90 tablet 1  . Evolocumab (REPATHA SURECLICK) 140 MG/ML SOAJ Inject 140 mg into the skin every 14 (fourteen) days. 2 pen 12  . fluticasone (FLONASE) 50 MCG/ACT nasal spray Place 1 spray into both nostrils daily as needed for allergies or rhinitis.    Marland Kitchen levocetirizine (XYZAL) 5 MG tablet Take 1 tablet by mouth daily as needed (allergies).     . losartan (COZAAR) 100 MG tablet Take 1 tablet (100 mg total) by mouth daily. 90 tablet 1  . metFORMIN (GLUCOPHAGE) 1000 MG tablet Take 1 tablet (1,000 mg total) by mouth 2 (two) times daily with a meal. 90 tablet 1  . Multiple Vitamin (MULTIVITAMIN) tablet Take 1 tablet by mouth daily.    . nortriptyline (PAMELOR) 10 MG capsule Take 30 mg by mouth at bedtime.    . empagliflozin (JARDIANCE) 10 MG TABS tablet Take 10 mg by mouth daily. 90 tablet 1  . meloxicam (MOBIC) 15 MG tablet Take 1 tablet (15 mg total) by mouth daily. 30 tablet 0  . omega-3 acid ethyl esters (LOVAZA) 1 g capsule Take 2 capsules (2 g total) by mouth 2 (two) times daily. 120 capsule 3   No current facility-administered medications for this visit.    Allergies  Allergen Reactions  . Fenofibrate Other (See Comments)    Facial flushing       Objective:  BP (!) 143/84   Pulse 90   Resp 14   Wt 241 lb (109.3 kg)   BMI 43.35 kg/m  Gen:  alert, not ill-appearing, no distress, appropriate for age, obese female HEENT: head normocephalic without obvious abnormality, conjunctiva and cornea clear, wearing glasses, trachea midline Pulm: Normal work of breathing, normal phonation, clear to auscultation bilaterally, no wheezes, rales or rhonchi CV: Normal rate, regular rhythm, s1 and  s2 distinct, no murmurs, clicks or rubs  Neuro: alert and oriented x 3, no tremor MSK: extremities atraumatic, normal gait and station Hands: hands are visibly swollen at the second and third MCP extending just below the PIP, no erythema or warmth, ROM intact, grip strength slightly diminished 4/5 bilaterally Skin: intact, no rashes on exposed skin, no jaundice, no cyanosis Psych: well-groomed, cooperative, good eye contact, depressed mood, affect mood-congruent, tearful, speech is articulate, and  thought processes clear and goal-directed  Diabetic Foot Exam - Simple   Simple Foot Form Diabetic Foot exam was performed with the following findings:  Yes 04/05/2018 12:16 PM  Visual Inspection No deformities, no ulcerations, no other skin breakdown bilaterally:  Yes Sensation Testing Intact to touch and monofilament testing bilaterally:  Yes Pulse Check Posterior Tibialis and Dorsalis pulse intact bilaterally:  Yes Comments       Results for orders placed or performed in visit on 04/05/18 (from the past 72 hour(s))  POCT HgB A1C     Status: None   Collection Time: 04/05/18  9:54 AM  Result Value Ref Range   Hemoglobin A1C  4.0 - 5.6 %   HbA1c POC (<> result, manual entry)  4.0 - 5.6 %   HbA1c, POC (prediabetic range)  5.7 - 6.4 %   HbA1c, POC (controlled diabetic range) 6.8 0.0 - 7.0 %  Lipid Profile     Status: Abnormal   Collection Time: 04/05/18 10:38 AM  Result Value Ref Range   Cholesterol 144 <200 mg/dL   HDL 24 (L) >40 mg/dL   Triglycerides 981 (H) <150 mg/dL   LDL Cholesterol (Calc)  mg/dL (calc)    Comment: . LDL cholesterol not calculated. Triglyceride levels greater than 400 mg/dL invalidate calculated LDL results. . Reference range: <100 . Desirable range <100 mg/dL for primary prevention;   <70 mg/dL for patients with CHD or diabetic patients  with > or = 2 CHD risk factors. Marland Kitchen LDL-C is now calculated using the Martin-Hopkins  calculation, which is a validated  novel method providing  better accuracy than the Friedewald equation in the  estimation of LDL-C.  Horald Pollen et al. Lenox Ahr. 1914;782(95): 2061-2068  (http://education.[facility].com/faq/FAQ164)    Total CHOL/HDL Ratio 6.0 (H) <5.0 (calc)   Non-HDL Cholesterol (Calc) 120 <130 mg/dL (calc)    Comment: For patients with diabetes plus 1 Clarke ASCVD risk  factor, treating to a non-HDL-C goal of <100 mg/dL  (LDL-C of <62 mg/dL) is considered a therapeutic  option.   Hepatic function panel     Status: Abnormal   Collection Time: 04/05/18 10:38 AM  Result Value Ref Range   Total Protein 7.6 6.1 - 8.1 g/dL   Albumin 4.2 3.6 - 5.1 g/dL   Globulin 3.4 1.9 - 3.7 g/dL (calc)   AG Ratio 1.2 1.0 - 2.5 (calc)   Total Bilirubin 0.4 0.2 - 1.2 mg/dL   Bilirubin, Direct 0.1 0.0 - 0.2 mg/dL   Indirect Bilirubin 0.3 0.2 - 1.2 mg/dL (calc)   Alkaline phosphatase (APISO) 72 33 - 115 U/L   AST 27 10 - 35 U/L   ALT 33 (H) 6 - 29 U/L   Dg Hand Complete Left  Result Date: 04/05/2018 CLINICAL DATA:  Swelling and pain.  No injury. EXAM: LEFT HAND - COMPLETE 3+ VIEW COMPARISON:  No recent. FINDINGS: No acute bony or joint abnormality identified. No evidence of fracture or dislocation. Mild degenerative change first carpometacarpal joint. IMPRESSION: Mild degenerative change first carpometacarpal joint. No acute bony abnormality. Electronically Signed   By: Maisie Fus  Register   On: 04/05/2018 15:51   Dg Hand Complete Right  Result Date: 04/05/2018 CLINICAL DATA:  Bilateral hand pain and swelling for 2 weeks without known injury. EXAM: RIGHT HAND - COMPLETE 3+ VIEW COMPARISON:  None. FINDINGS: There is no evidence of fracture or dislocation. There is no evidence of arthropathy or other focal bone abnormality. Soft tissues are unremarkable. IMPRESSION: Normal right hand. Electronically  Signed   By: Lupita Raider, M.D.   On: 04/05/2018 15:51      Assessment and Plan: 47 y.o. female with   Encounter for  medication management - Plan: Lipid Profile, Hepatic function panel  Type 2 diabetes mellitus with other specified complication, without long-term current use of insulin (HCC) - Plan: POCT HgB A1C, atorvastatin (LIPITOR) 40 MG tablet, metFORMIN (GLUCOPHAGE) 1000 MG tablet, Hepatic function panel, empagliflozin (JARDIANCE) 10 MG TABS tablet  Hypertriglyceridemia - Plan: atorvastatin (LIPITOR) 40 MG tablet, Lipid Profile, omega-3 acid ethyl esters (LOVAZA) 1 g capsule  Elevated blood pressure reading in office with diagnosis of hypertension  Hyperlipidemia associated with type 2 diabetes mellitus (HCC) - Plan: Lipid Profile  Breast cancer screening - Plan: MM 3D SCREEN BREAST BILATERAL  Grief reaction with prolonged bereavement - Plan: Ambulatory referral to Psychiatry  Swelling of joint of left hand - Plan: meloxicam (MOBIC) 15 MG tablet, DG Hand Complete Left  Swelling of joint of right hand - Plan: meloxicam (MOBIC) 15 MG tablet, DG Hand Complete Right  Postmenopausal  Type 2 Diabetes Lab Results  Component Value Date   HGBA1C 6.8 04/05/2018  - A1c at goal. Given significant CV risk factors, will start Jardiance in addition to Metformin - BP out of range. Goal <130/80. Patient to monitor BP at home and follow-up if readings consistently >130/80 - counseled on ADA diet - foot exam performed today and normal. Counseled on foot care - cont statin - cont ARB - immunizations UTD  Bilateral hand swelling and pain - distribution is likely OA - X-rays today - Meloxicam once daily prn - follow-up with Sports Medicine if no improvement in 4 weeks  Prolonged grief reaction PHQ2=2, PHQ9=10, mildly moderate, no acute safety issues - referring to [facility] for grief counseling - patient declines medication at this time  Postmenopausal - hx of PCOS and irregular menses.  - labs 7 months ago showed normal prolactin, FSH mid-cycle peak, LH 10.1 LMP >1 year ago now. Likely premature ovarian  failure  Patient education and anticipatory guidance given Patient agrees with treatment plan Follow-up in 6 months or sooner as needed if symptoms worsen or fail to improve  Levonne Hubert PA-C

## 2018-04-06 ENCOUNTER — Telehealth: Payer: Self-pay

## 2018-04-06 ENCOUNTER — Encounter: Payer: Self-pay | Admitting: Physician Assistant

## 2018-04-06 DIAGNOSIS — Z780 Asymptomatic menopausal state: Secondary | ICD-10-CM | POA: Insufficient documentation

## 2018-04-06 DIAGNOSIS — M189 Osteoarthritis of first carpometacarpal joint, unspecified: Secondary | ICD-10-CM | POA: Insufficient documentation

## 2018-04-06 MED ORDER — OMEGA-3-ACID ETHYL ESTERS 1 G PO CAPS: 2.0000 g | ORAL_CAPSULE | Freq: Two times a day (BID) | ORAL | 3 refills | Status: DC

## 2018-04-06 MED ORDER — EMPAGLIFLOZIN 10 MG PO TABS: 10.0000 mg | ORAL_TABLET | Freq: Every day | ORAL | 1 refills | Status: DC

## 2018-04-06 NOTE — Telephone Encounter (Signed)
Pt is questioning why she is taking jardiance.  Please advise. -EH/RMA

## 2018-04-06 NOTE — Progress Notes (Signed)
Hi Mary Clarke,  Your triglycerides are very very high. At this level, you are at risk for acute pancreatitis (inflammation of your pancreas). I will send these results to your lipid specialist. I sent in a prescription for a triglyceride lowing medicine called Lovaza. It works differently than Fenofibrate, so you should not have flushing. It is a pure form of DHA and EPA found in fish oil.  I am also sending in a diabetes medication called Jardiance. This was recommended by your lipid specialist. It lowers your A1C level like Metformin, but it also has additional benefit of preventing heart attacks in people with diabetes. You may be able to find a discount coupon for this medication online. Or you can pick one up from our office. This needs to be activated before picking up the prescription from your pharmacy in order to get a discount on your co-pay.  Lastly, both Lovaza and Jardiance may require a prior authorization with your insurance. Please allow us 2 weeks to obtain the PA.  Best, [name]

## 2018-04-06 NOTE — Telephone Encounter (Signed)
There is a detailed explanation in her MyChart It was recommended by her lipid specialist. It is a diabetic medication that not only lowers A1c but reduces the 5 year risk and survival of having a heart attack

## 2018-04-09 NOTE — Telephone Encounter (Signed)
Pt notified of recommendations.  Pt verbalized understanding -EH/RMA

## 2018-04-11 ENCOUNTER — Ambulatory Visit (INDEPENDENT_AMBULATORY_CARE_PROVIDER_SITE_OTHER): Payer: BLUE CROSS/BLUE SHIELD | Admitting: Licensed Clinical Social Worker

## 2018-04-11 ENCOUNTER — Ambulatory Visit (INDEPENDENT_AMBULATORY_CARE_PROVIDER_SITE_OTHER): Payer: BLUE CROSS/BLUE SHIELD

## 2018-04-11 DIAGNOSIS — Z1239 Encounter for other screening for malignant neoplasm of breast: Secondary | ICD-10-CM

## 2018-04-11 DIAGNOSIS — F4329 Adjustment disorder with other symptoms: Secondary | ICD-10-CM

## 2018-04-11 DIAGNOSIS — F331 Major depressive disorder, recurrent, moderate: Secondary | ICD-10-CM

## 2018-04-11 DIAGNOSIS — Z1231 Encounter for screening mammogram for malignant neoplasm of breast: Secondary | ICD-10-CM

## 2018-04-11 DIAGNOSIS — F4381 Prolonged grief disorder: Secondary | ICD-10-CM
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
## 2018-04-16 ENCOUNTER — Telehealth: Payer: Self-pay

## 2018-04-16 NOTE — Telephone Encounter (Signed)
Pt is asking about her hand x-ray results. Please advise. -EH/RMA

## 2018-04-17 DIAGNOSIS — R55 Syncope and collapse: Secondary | ICD-10-CM | POA: Diagnosis not present

## 2018-04-17 DIAGNOSIS — R531 Weakness: Secondary | ICD-10-CM | POA: Diagnosis not present

## 2018-04-17 DIAGNOSIS — Z79899 Other long term (current) drug therapy: Secondary | ICD-10-CM | POA: Diagnosis not present

## 2018-04-17 DIAGNOSIS — E282 Polycystic ovarian syndrome: Secondary | ICD-10-CM | POA: Diagnosis not present

## 2018-04-17 DIAGNOSIS — E119 Type 2 diabetes mellitus without complications: Secondary | ICD-10-CM | POA: Diagnosis not present

## 2018-04-17 DIAGNOSIS — R9431 Abnormal electrocardiogram [ECG] [EKG]: Secondary | ICD-10-CM | POA: Diagnosis not present

## 2018-04-17 DIAGNOSIS — I951 Orthostatic hypotension: Secondary | ICD-10-CM | POA: Diagnosis not present

## 2018-04-17 DIAGNOSIS — R42 Dizziness and giddiness: Secondary | ICD-10-CM | POA: Diagnosis not present

## 2018-04-17 NOTE — Telephone Encounter (Signed)
Results left on vm -EH/RMA  

## 2018-04-20 ENCOUNTER — Telehealth: Payer: Self-pay

## 2018-04-20 NOTE — Telephone Encounter (Signed)
Pt called stating that she went to the ED on July 9th and received fluids from being dehydrated.  Pt thinks that it is one of the new medications she started that is making her feel fatigued and bad. Pt reports that since receiving fluids in ED, she has been drinking at least 5 bottles of water every day. She is not sure if she need to be seen again for more fluids?

## 2018-04-20 NOTE — Telephone Encounter (Signed)
Spoke to Dacomaharley concerning this pt- Mary BergamoCharley notes that patient should continue to push fluids and also she needs to hold her Jardiance until her next OV.   I called pt and advised her of Charley's recommendations on her VM, per her request. I also advised pt that if she develops N&V or if she begins to feel extreme weakness or feels as if she is going to pass out, she should go back to ER to be evaluated.

## 2018-05-02 ENCOUNTER — Ambulatory Visit (INDEPENDENT_AMBULATORY_CARE_PROVIDER_SITE_OTHER): Payer: BLUE CROSS/BLUE SHIELD | Admitting: Licensed Clinical Social Worker

## 2018-05-02 DIAGNOSIS — F331 Major depressive disorder, recurrent, moderate: Secondary | ICD-10-CM | POA: Diagnosis not present

## 2018-05-02 DIAGNOSIS — F4329 Adjustment disorder with other symptoms: Secondary | ICD-10-CM | POA: Diagnosis not present

## 2018-05-02 DIAGNOSIS — F4381 Prolonged grief disorder: Secondary | ICD-10-CM

## 2018-05-02 NOTE — Progress Notes (Signed)
THERAPIST PROGRESS NOTE  Session Time: 4:03 PM to 5:00 PM  Participation Level: Active  Behavioral Response: CasualAlertDysphoric and tearful throughout session in discussing grief  Type of Therapy: Individual Therapy  Treatment Goals addressed: patient processed through grief, decrease depressive symptoms  Interventions: Solution Focused, Strength-based, Supportive and Other: process through grief  Summary: Mary Clarke is a 47 y.o. female who presents with Clarke depressive disorder, recurrent, moderate, prolonged grief reaction.   Suicidal/Homicidal: No  Therapist Response: Patient checked in related that the symptoms are the same, related that every day in some way and some form, has grief issues,  some days not as bad as others, when recent anniversay occurred of their deaths, it was tough handled and got through it. Relates that she handled it by thinking about good memories, not focusing on them gone, relates that it is difficult because the losses happened so close together, difficulty of understanding why. Relates when they died she didn't have time to grieve was living with brother, and didn't have a place once he died had to take care of basic needs of finding a home, shares that she "lost everything when he passed", didn't get her change to say good-bye, didn't have time to say good-bye. Share his grief has come back and past 6 months coming back, stronger in last 2 months.therapist relates grief work help to alleviate symptoms, may still experience the loss but less frequently. Patient feels symptoms the same for her have not gotten better with grief of loss of dad in 1989 and mom passed in 2000, relates does not think of them daily but still as frequently as before. Shares that it is hard to not think of them and brothers at the same time. Therapist discuss dual process of grief that one goes back and forth from restorative type activities and loss oriented activities, patient  feels that beginning after loss she was more involved in restorative type activities, building a new life and that it was hard to build a new life and identifies's current symptoms of loss oriented activities. Discussed relationship with her second oldest brother, she has good memories of them growing up, their relationship got worse over the years  because of sister-in-law pulling him him away from family. Discussed helpful rituals to help mourn, patient recognizes signs that they are there. Discussed memories of older brother, he was excessively kind until he became hard after being taken to manage of in a relationship, nothing mattered as much to him as family, older brother,Mary Clarke, always there for her and mom, saw her like little sister, made sure they had what needed and wanted. Shares her feeling that she has different type of family from losing dad young and significant age Of patient who was the youngest in next oldest 2011 years older to older sibling 20 years older. Closer to older sister than middle sister. Patient able to share helpful way of grieving and that she strives to be the person they would want her to be. Patient discussed medication of Pamelor for sleep not helping and will review with primary care doctor.completed treatment plan Assessed patient current functioning per report and helped in the initial session to begin grieving process by having patient share memories of her brothers, identifying his more acute because they died in a short period of time.discussed dual process model the grief process involving alternating between loss oriented activities such as crying and sadness anger, dwelling on the circumstances of the death and avoiding restorative type activities  and alternating with more restorative type activities such as adapting to new role,cultivating new way of life, discuss helpful and unhelpful strategies and that engaging in restorative type activities are helpful approaches to  grieving but also related grief process toward healing involves dealing with painful memories, talking about once feelings of loss and helping to work through it. Discussed specifically activities that could help with grieving and explored with patient including finding ways to commemorate loss, spirituality is helpful, though that they will always live in you and through you  You are able to bring forth their "positive qualities in your own life. Discussed recognizing value and meaning in your life even with losses and consider not allowing grief process to take over so not able to have full life although also recognizing grief process one must go through that is unique and different for everyone. Provided strength based and supportive intervention. Plan: Return again in 2 weeks.2.therapist continued to work with patient on grief processing, coping strategies to decrease depression  Diagnosis: Axis I: Clarke depressive disorder, recurrent, moderate, prolonged grief reaction    Axis II: No diagnosis    Coolidge Breeze, LCSW 05/02/2018

## 2018-05-23 ENCOUNTER — Ambulatory Visit (INDEPENDENT_AMBULATORY_CARE_PROVIDER_SITE_OTHER): Payer: BLUE CROSS/BLUE SHIELD | Admitting: Licensed Clinical Social Worker

## 2018-05-23 DIAGNOSIS — F331 Major depressive disorder, recurrent, moderate: Secondary | ICD-10-CM

## 2018-05-23 DIAGNOSIS — F4329 Adjustment disorder with other symptoms: Secondary | ICD-10-CM

## 2018-05-23 DIAGNOSIS — F4381 Prolonged grief disorder: Secondary | ICD-10-CM
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
## 2018-06-18 ENCOUNTER — Ambulatory Visit (INDEPENDENT_AMBULATORY_CARE_PROVIDER_SITE_OTHER): Payer: BLUE CROSS/BLUE SHIELD | Admitting: Licensed Clinical Social Worker

## 2018-06-18 DIAGNOSIS — F4329 Adjustment disorder with other symptoms: Secondary | ICD-10-CM

## 2018-06-18 DIAGNOSIS — F331 Major depressive disorder, recurrent, moderate: Secondary | ICD-10-CM | POA: Diagnosis not present

## 2018-06-18 DIAGNOSIS — F4381 Prolonged grief disorder: Secondary | ICD-10-CM

## 2018-06-18 NOTE — Progress Notes (Addendum)
THERAPIST PROGRESS NOTE  Session Time: 1:05 PM to 2:00 PM  Participation Level: Active  Behavioral Response: CasualAlertmood lability, at times euythymic and other times dysphoric and tearful  Type of Therapy: Individual Therapy  Treatment Goals addressed:   patient processed through grief, decrease depressive symptoms  Interventions: Solution Focused, Strength-based, Supportive and Other: focused breathing-coping for anxiety, processing of grief  Summary: Mary Clarke is a 47 y.o. female who presents with major depressive disorder, recurrent, moderate, prolonged grief reaction.     Suicidal/Homicidal: No  Therapist Response: Patient shares that doesn't cry when talked about loss of brothers and feels this has been a positive and huge change. Shares that topic of losing brothers doesn't come up as much, has found a way to manage it on her own, shares that she does this by remembering past happy memories. Shares the intense emotions have eased off a lot, feels like she has reacher her "new normal", can cope on her own. Discussed loss of father when she was 8, she was "daddy's girl", thinks about whether if he had lived, how her life would have been different. Therapist discussed being in the present to focus on her life now to help her in more fully experiencing her life. Patient shares not having gotten over loss of her father, grief has not resolved, therapist discussed realization that we can process her grief but recognize and may be something that we do not fully get over. Discussed how older brother stepped in as a female role model and's older sister, how her brothers and sisters her a lot older than her how their family structure is a lot different from others and didn't realize it until older therapist discussed with patient value of brothers and sisters who are there for her. Care and being "bonded" to niece and how it has been patient and her cans the world. Discussed anxiety symptoms  and patient can't figure out triggers, come out of nowhere, sleep worse, unable to go to sleep and stay asleep, wakes up after an hour and a half, goes to sleep and then will wake up 2 hours later. Therapist introduced focused breathing, discuss relaxation strategies as a lifestyle change scared can help one to be calmer. Aid to pay attention to thoughts) down so one can challenge them for accuracy  Therapist assess patient current functioning per report and identified patient making progress with grief as she is able to ways on her own by remembering happy memories to help her in self managing her emotions. Process with patient grief related to father she lost when 39 years old,discuss helpfulness of having siblings who stepped in to fill rolesas parental figures. Encouraged patient to write down emotions, thinking and events to capture better any triggers, and writing down she can more critically analyze as well as better able to find triggers.Discussed lifestyle changes that will help with decrease in overall anxiety discussed neurobiology and how relaxation strategies can impact parts of the brain connected with anxiety, help to decrease activation of these parts to decrease overall anxiety and help one better cope in situations when one is anxious. Discuss anxiety can be related to 1 whose genetically predispose, panic as fighter flight activation and how relaxation strategies can help physiological arousal, also how cognitions such as negative forecasting also play a part. Provided strength based and supportive intervention  Plan: Return again in 3 weeks.2.Patient review handout on "focused breathing". 3.therapist introduced mindfulness and continue to work with patient on coping strategies for  anxiety, processing of grief  Diagnosis: Axis I:  major depressive disorder, recurrent, moderate, prolonged grief reaction    Axis II: No diagnosis    Coolidge Breeze, LCSW 06/18/2018

## 2018-06-26 NOTE — Progress Notes (Signed)
HPI: FU mitral regurgitation. Echocardiogram March 2018 showed normal LV function, eccentric mitral regurgitation directed posteriorly and difficult to quantitate but appeared mild. TEE June 2018 showed normal LV systolic function, prolapse of the anterior mitral valve leaflet with severe mitral regurgitation and mild left atrial enlargement. Cardiac catheterization June 2018 showed an occluded right coronary artery which was collateralized from the left system and no obstructive disease otherwise. Moderate mitral regurgitation with normal pulmonary capillary wedge pressure and no V-wave. Ejection fraction normal.   Monitor May 2019 showed sinus rhythm.  Since last seen,  she notes dyspnea with more vigorous activities.  No orthopnea or PND.  Chronic mild pedal edema.  No chest pain or syncope.  Current Outpatient Medications  Medication Sig Dispense Refill  . aspirin 81 MG chewable tablet Chew 81 mg by mouth once.    Marland Kitchen. atorvastatin (LIPITOR) 40 MG tablet Take 1 tablet (40 mg total) by mouth at bedtime. 90 tablet 1  . Evolocumab (REPATHA SURECLICK) 140 MG/ML SOAJ Inject 140 mg into the skin every 14 (fourteen) days. 2 pen 12  . fluticasone (FLONASE) 50 MCG/ACT nasal spray Place 1 spray into both nostrils daily as needed for allergies or rhinitis.    Marland Kitchen. levocetirizine (XYZAL) 5 MG tablet Take 1 tablet by mouth daily as needed (allergies).     . magnesium gluconate (MAGONATE) 500 MG tablet Take 500 mg by mouth daily.    . metFORMIN (GLUCOPHAGE) 1000 MG tablet Take 1 tablet (1,000 mg total) by mouth 2 (two) times daily with a meal. 90 tablet 1  . nortriptyline (PAMELOR) 10 MG capsule Take 30 mg by mouth at bedtime.    Marland Kitchen. omega-3 acid ethyl esters (LOVAZA) 1 g capsule Take 2 capsules (2 g total) by mouth 2 (two) times daily. 120 capsule 3  . valsartan (DIOVAN) 160 MG tablet Take 1 tablet (160 mg total) by mouth daily. 90 tablet 1  . vitamin B-12 (CYANOCOBALAMIN) 500 MCG tablet Take 500 mcg by mouth  daily.     No current facility-administered medications for this visit.      Past Medical History:  Diagnosis Date  . Allergy   . Anxiety   . Chronic bronchitis (HCC)    per pt stopped when I quit smoking  . Depression   . Diabetes mellitus without complication (HCC)   . Elevated calcitonin level 08/15/2017  . Heart murmur   . Hyperlipidemia   . Hypertension   . PCOS (polycystic ovarian syndrome)   . Severe mitral regurgitation by prior echocardiogram 01/04/2017  . Sleep apnea     Past Surgical History:  Procedure Laterality Date  . CHOLECYSTECTOMY    . RIGHT/LEFT HEART CATH AND CORONARY ANGIOGRAPHY Right 04/07/2017   Procedure: Right/Left Heart Cath and Coronary Angiography;  Surgeon: Lyn RecordsSmith, Henry W, MD;  [facility];  Service: Cardiovascular;  Laterality: Right;  . TEE WITHOUT CARDIOVERSION N/A 04/07/2017   Procedure: TRANSESOPHAGEAL ECHOCARDIOGRAM (TEE);  Surgeon: Lewayne Buntingrenshaw, Brian S, MD;  [facility];  Service: Cardiovascular;  Laterality: N/A;    Social History   Socioeconomic History  . Marital status: Divorced    Spouse name: Not on file  . Number of children: Not on file  . Years of education: Not on file  . Highest education level: Not on file  Occupational History    Comment: Retail  Social Needs  . Financial resource strain: Not on file  . Food insecurity:    Worry: Not on  file    Inability: Not on file  . Transportation needs:    Medical: Not on file    Non-medical: Not on file  Tobacco Use  . Smoking status: Former Smoker    Packs/day: 1.00    Years: 15.00    Pack years: 15.00    Types: Cigarettes    Last attempt to quit: 06/11/2015    Years since quitting: 3.0  . Smokeless tobacco: Never Used  . Tobacco comment: 2nd hand smoke exposure current  Substance and Sexual Activity  . Alcohol use: No  . Drug use: Never  . Sexual activity: Not Currently    Birth control/protection: Post-menopausal  Lifestyle  . Physical  activity:    Days per week: Not on file    Minutes per session: Not on file  . Stress: Not on file  Relationships  . Social connections:    Talks on phone: Not on file    Gets together: Not on file    Attends religious service: Not on file    Active member of club or organization: Not on file    Attends meetings of clubs or organizations: Not on file    Relationship status: Not on file  . Intimate partner violence:    Fear of current or ex partner: Not on file    Emotionally abused: Not on file    Physically abused: Not on file    Forced sexual activity: Not on file  Other Topics Concern  . Not on file  Social History Narrative  . Not on file    Family History  Problem Relation Age of Onset  . Heart attack Mother 61  . Hypertension Mother   . Heart attack Father 54  . Diabetes Brother   . Aneurysm Brother   . Hypertension Brother   . Stroke Brother   . Heart attack Brother 43  . Thyroid disease Brother   . Lung cancer Brother   . Diabetes Brother     ROS: no fevers or chills, productive cough, hemoptysis, dysphasia, odynophagia, melena, hematochezia, dysuria, hematuria, rash, seizure activity, orthopnea, PND, claudication. Remaining systems are negative.  Physical Exam: Well-developed well-nourished in no acute distress.  Skin is warm and dry.  HEENT is normal.  Neck is supple.  Chest is clear to auscultation with normal expansion.  Cardiovascular exam is regular rate and rhythm.  2/6 systolic murmur apex Abdominal exam nontender or distended. No masses palpated. Extremities show no edema. neuro grossly intact  ECG-sinus rhythm at a rate of 91.  Nonspecific ST changes.  Personally reviewed  A/P  1 coronary artery disease-plan to continue medical therapy with aspirin and statin.  2 mitral regurgitation-severe on previous transesophageal echocardiogram.  Moderate by catheterization with normal right heart pressures.  We will plan to proceed with follow-up  echocardiogram.  She may require surgical intervention in the future if LV function deteriorates, there is left ventricular enlargement or she develops symptoms.  3 hypertension-blood pressure is elevated.  However her ARB was just prescribed by primary care.  She will begin that medication today.  I have asked her to follow this and we will adjust regimen as needed.  4 hyperlipidemia-continue statin.    5 obesity-we discussed importance of weight loss.  Olga Millers, MD

## 2018-07-02 ENCOUNTER — Ambulatory Visit (INDEPENDENT_AMBULATORY_CARE_PROVIDER_SITE_OTHER): Payer: BLUE CROSS/BLUE SHIELD | Admitting: Licensed Clinical Social Worker

## 2018-07-02 ENCOUNTER — Encounter: Payer: Self-pay | Admitting: Physician Assistant

## 2018-07-02 ENCOUNTER — Ambulatory Visit (INDEPENDENT_AMBULATORY_CARE_PROVIDER_SITE_OTHER): Payer: BLUE CROSS/BLUE SHIELD | Admitting: Physician Assistant

## 2018-07-02 VITALS — BP 159/91 | HR 83 | Wt 241.0 lb

## 2018-07-02 DIAGNOSIS — I152 Hypertension secondary to endocrine disorders: Secondary | ICD-10-CM

## 2018-07-02 DIAGNOSIS — F4329 Adjustment disorder with other symptoms: Secondary | ICD-10-CM

## 2018-07-02 DIAGNOSIS — R252 Cramp and spasm: Secondary | ICD-10-CM | POA: Diagnosis not present

## 2018-07-02 DIAGNOSIS — R740 Nonspecific elevation of levels of transaminase and lactic acid dehydrogenase [LDH]: Secondary | ICD-10-CM

## 2018-07-02 DIAGNOSIS — E1159 Type 2 diabetes mellitus with other circulatory complications: Secondary | ICD-10-CM | POA: Diagnosis not present

## 2018-07-02 DIAGNOSIS — R7401 Elevation of levels of liver transaminase levels: Secondary | ICD-10-CM

## 2018-07-02 DIAGNOSIS — E538 Deficiency of other specified B group vitamins: Secondary | ICD-10-CM | POA: Diagnosis not present

## 2018-07-02 DIAGNOSIS — I739 Peripheral vascular disease, unspecified: Secondary | ICD-10-CM

## 2018-07-02 DIAGNOSIS — F331 Major depressive disorder, recurrent, moderate: Secondary | ICD-10-CM

## 2018-07-02 DIAGNOSIS — I10 Essential (primary) hypertension: Secondary | ICD-10-CM

## 2018-07-02 DIAGNOSIS — F4381 Prolonged grief disorder: Secondary | ICD-10-CM

## 2018-07-02 DIAGNOSIS — Z23 Encounter for immunization: Secondary | ICD-10-CM | POA: Diagnosis not present

## 2018-07-02 MED ORDER — VALSARTAN 160 MG PO TABS: 160.0000 mg | ORAL_TABLET | Freq: Every day | ORAL | 1 refills | Status: DC

## 2018-07-02 NOTE — Patient Instructions (Signed)
Leg Cramps Leg cramps occur when a muscle or muscles tighten and you have no control over this tightening (involuntary muscle contraction). Muscle cramps can develop in any muscle, but the most common place is in the calf muscles of the leg. Those cramps can occur during exercise or when you are at rest. Leg cramps are painful, and they may last for a few seconds to a few minutes. Cramps may return several times before they finally stop. Usually, leg cramps are not caused by a serious medical problem. In many cases, the cause is not known. Some common causes include:  Overexertion.  Overuse from repetitive motions, or doing the same thing over and over.  Remaining in a certain position for a long period of time.  Improper preparation, form, or technique while performing a sport or an activity.  Dehydration.  Injury.  Side effects of some medicines.  Abnormally low levels of the salts and ions in your blood (electrolytes), especially potassium and calcium. These levels could be low if you are taking water pills (diuretics) or if you are pregnant.  Follow these instructions at home: Watch your condition for any changes. Taking the following actions may help to lessen any discomfort that you are feeling:  Stay well-hydrated. Drink enough fluid to keep your urine clear or pale yellow.  Try massaging, stretching, and relaxing the affected muscle. Do this for several minutes at a time.  For tight or tense muscles, use a warm towel, heating pad, or hot shower water directed to the affected area.  If you are sore or have pain after a cramp, applying ice to the affected area may relieve discomfort. ? Put ice in a plastic bag. ? Place a towel between your skin and the bag. ? Leave the ice on for 20 minutes, 2-3 times per day.  Avoid strenuous exercise for several days if you have been having frequent leg cramps.  Make sure that your diet includes the essential minerals for your muscles to  work normally.  Take medicines only as directed by your health care provider.  Contact a health care provider if:  Your leg cramps get more severe or more frequent, or they do not improve over time.  Your foot becomes cold, numb, or blue. This information is not intended to replace advice given to you by your health care provider. Make sure you discuss any questions you have with your health care provider. Document Released: 11/03/2004 Document Revised: 03/03/2016 Document Reviewed: 09/03/2014 Elsevier Interactive Patient Education  2018 [employer]  

## 2018-07-02 NOTE — Progress Notes (Signed)
HPI:                                                                Mary Clarke is a 47 y.o. female [name] today for leg cramping  Pleasant 47 yo F with PMH DM2, HLD, HTN, MR presents with 1 month of leg cramps, gradually worsening. Endorses bilateral lower extremity cramping "all the time."   Describes "a tightening feeling" and a "twisting contraction." Pain is migratory and at times located in thighs and calves.  Worse at night and worse after prolonged sitting. Symptoms occur both at rest and with ambulation. She also has intermittent left ankle swelling. Denies leg swelling or redness. Denies recent surgery/trauma. Denies history of VTE.    Past Medical History:  Diagnosis Date  . Allergy   . Anxiety   . Chronic bronchitis (HCC)    per pt stopped when I quit smoking  . Depression   . Diabetes mellitus without complication (HCC)   . Elevated calcitonin level 08/15/2017  . Heart murmur   . Hyperlipidemia   . Hypertension   . PCOS (polycystic ovarian syndrome)   . Severe mitral regurgitation by prior echocardiogram 01/04/2017  . Sleep apnea    Past Surgical History:  Procedure Laterality Date  . CHOLECYSTECTOMY    . RIGHT/LEFT HEART CATH AND CORONARY ANGIOGRAPHY Right 04/07/2017   Procedure: Right/Left Heart Cath and Coronary Angiography;  Surgeon: Lyn RecordsSmith, Henry W, MD;  [facility];  Service: Cardiovascular;  Laterality: Right;  . TEE WITHOUT CARDIOVERSION N/A 04/07/2017   Procedure: TRANSESOPHAGEAL ECHOCARDIOGRAM (TEE);  Surgeon: Lewayne Buntingrenshaw, Brian S, MD;  [facility];  Service: Cardiovascular;  Laterality: N/A;   Social History   Tobacco Use  . Smoking status: Former Smoker    Packs/day: 1.00    Years: 15.00    Pack years: 15.00    Types: Cigarettes    Last attempt to quit: 06/11/2015    Years since quitting: 3.0  . Smokeless tobacco: Never Used  . Tobacco comment: 2nd hand  smoke exposure current  Substance Use Topics  . Alcohol use: No   family history includes Aneurysm in her brother; Diabetes in her brother and brother; Heart attack (age of onset: 3843) in her brother; Heart attack (age of onset: 4844) in her father; Heart attack (age of onset: 8155) in her mother; Hypertension in her brother and mother; Lung cancer in her brother; Stroke in her brother; Thyroid disease in her brother.    ROS: negative except as noted in the HPI  Medications: Current Outpatient Medications  Medication Sig Dispense Refill  . aspirin 81 MG chewable tablet Chew 81 mg by mouth once.    Marland Kitchen. atorvastatin (LIPITOR) 40 MG tablet Take 1 tablet (40 mg total) by mouth at bedtime. 90 tablet 1  . Evolocumab (REPATHA SURECLICK) 140 MG/ML SOAJ Inject 140 mg into the skin every 14 (fourteen) days. 2 pen 12  . fluticasone (FLONASE) 50 MCG/ACT nasal spray Place 1 spray into both nostrils daily as needed for allergies or rhinitis.    Marland Kitchen. levocetirizine (XYZAL) 5 MG tablet Take 1 tablet by mouth daily as needed (allergies).     . losartan (COZAAR) 100 MG tablet Take 1  tablet (100 mg total) by mouth daily. 90 tablet 1  . metFORMIN (GLUCOPHAGE) 1000 MG tablet Take 1 tablet (1,000 mg total) by mouth 2 (two) times daily with a meal. 90 tablet 1  . nortriptyline (PAMELOR) 10 MG capsule Take 30 mg by mouth at bedtime.    Marland Kitchen omega-3 acid ethyl esters (LOVAZA) 1 g capsule Take 2 capsules (2 g total) by mouth 2 (two) times daily. 120 capsule 3   No current facility-administered medications for this visit.    Allergies  Allergen Reactions  . Fenofibrate Other (See Comments)    Facial flushing       Objective:  BP (!) 169/97   Pulse 83   Wt 241 lb (109.3 kg)   LMP 01/22/2016 (Approximate)   BMI 43.35 kg/m  Gen:  alert, not ill-appearing, no distress, appropriate for age, obese female HEENT: head normocephalic without obvious abnormality, conjunctiva and cornea clear, trachea midline Pulm: Normal  work of breathing, normal phonation CV: PT pulses diminished bilaterally, DP pulses 2+ symmetric Neuro: alert and oriented x 3, no tremor MSK: extremities atraumatic, normal gait and station, no calf tenderness, no peripheral edema, brisk capillary refill Skin: intact, no rashes on exposed skin, no jaundice, no cyanosis  No results found for this or any previous visit (from the past 72 hour(s)). No results found.    Assessment and Plan: 47 y.o. female with   .Mary Clarke was seen today for leg cramps.  Diagnoses and all orders for this visit:  Cramp of both lower extremities -     COMPLETE METABOLIC PANEL WITH GFR -     Vitamin B12 -     Magnesium -     CK  Need for immunization against influenza -     Flu Vaccine QUAD 36+ mos IM  Intermittent claudication (HCC) -     Cancel: US ARTERIAL ABI (SCREENING LOWER EXTREMITY) -     US ARTERIAL ABI (SCREENING LOWER EXTREMITY)  Hypertension associated with diabetes (HCC) -     valsartan (DIOVAN) 160 MG tablet; Take 1 tablet (160 mg total) by mouth daily.   - migratory bilateral lower extremity cramps involving the calves and thighs - differential includes medication adverse effect (Atorvastatin), PVD, peripheral neuropathy, lumbar spinal stenosis - B12, CK, CMP pending - history is not entirely consistent with claudication, but given her severe HLD and diminished PT pulses, I will obtain ABI to screen for PVD  - BP also noted to be out of range in office today. Home SBP are high 130's-low 140's. Switching to Valsartan.   Patient education and anticipatory guidance given Patient agrees with treatment plan Follow-up in 2 weeks or sooner as needed if symptoms worsen or fail to improve  Levonne Hubert PA-C

## 2018-07-02 NOTE — Progress Notes (Signed)
THERAPIST PROGRESS NOTE  Session Time: 8:02 PM to 8:58 PM  Participation Level: Active  Behavioral Response: CasualAlertsuibdued  Type of Therapy: Individual Therapy  Treatment Goals addressed:  patient processed through grief, decrease depressive symptoms  Interventions: CBT, Solution Focused, Strength-based, Supportive, Reframing and Other: grief processing  Summary: Mary Clarke is a 47 y.o. female who presents with major depressive disorder, recurrent, moderate, prolonged grief reaction. .   Suicidal/Homicidal: No  Therapist Response: Patient shares that she had a couple difficult weeks, couldn't shake the anxiety, tried breathing, music all the thing that were working. Shares that there was nothing triggering to write down. Doesn't know if it was a program she was watching, the same place that her brother had gone to racing track before car crash. Brought back  memories of mom, dad, brother. Relates anniversary of dad's death is coming up in 08/11/2023. Talked to sister about whether bringing memories to forefront in therapy is worsening symptoms, sister's feedback was to stay with therapy to help her work her way through grief. Shares previously fighting against reality of their death, now not fighting the way she was. Working on letting it go, go with it and work through it as best as she can. Says though since last session, living in it and not being able to let go of emotion of grief, "sitting in the emotion and can't do anything else". Shares a combination of everything, within five years lost two best friends and two brothers. Discussed patient impacted by anniversaries, finding a ritual to help her to honor their memories. Patient tearful in session and tearful in asking who will mourn her. Discussed ways to challenge the thought and patient shared  "don't worry I am living now and I have the opportunity to make a difference, every day is not always going to be easy, can't just stop  and live in the sorrow". Patient reviewed main things from session and she relates that she can make a difference, she is not the only one going through it, new ways to be able to manage the grief because previous ways weren't working, think she needs to write a good-bye letter.  Therapist to assess patient current functioning per report. Continue to help patient process through grief and discussed coping strategies in helping her work through grief. Identify cognitions that continue the grief including a need to continue to grieve to honor them and that if stop then not keeping their memory alive and it wasn't fair. Discussed grief as honoring memory but at the same time containing the grief, finding ways to reframe that focuses on gratefulness of having the love ones in one's life, memories as a treasure one will always have. Discussed spiritual coping still having connection with them, encouraged patient with finding ways to honor memory during anniversary since they are difficult and she does commemorates them. Encouraged her through reframing to focus on her life and making a difference in recognizing not to lose focus of value of her own life, not let grief take that away from her. In session help process through grief to patient talking about her memories of people she lost. Provided strength based and supportive intervention  Plan: Return again in 2 weeks.2.therapist continued to help patient with processing grief and coping with grief, and in eventually patient identifies needing to write a goodbye letter 3.ontinue to work with patient on coping strategies for mental health symptoms  Diagnosis: Axis I:  major depressive disorder, recurrent, moderate, prolonged grief reaction  Axis II: No diagnosis    Coolidge BreezeMary Bowman, LCSW 07/02/2018

## 2018-07-03 LAB — COMPLETE METABOLIC PANEL WITH GFR
AG Ratio: 1.3 (calc) (ref 1.0–2.5)
ALBUMIN MSPROF: 4 g/dL (ref 3.6–5.1)
ALKALINE PHOSPHATASE (APISO): 65 U/L (ref 33–115)
ALT: 45 U/L — ABNORMAL HIGH (ref 6–29)
AST: 41 U/L — ABNORMAL HIGH (ref 10–35)
BILIRUBIN TOTAL: 0.3 mg/dL (ref 0.2–1.2)
BUN: 17 mg/dL (ref 7–25)
CHLORIDE: 100 mmol/L (ref 98–110)
CO2: 25 mmol/L (ref 20–32)
Calcium: 9.6 mg/dL (ref 8.6–10.2)
Creat: 0.66 mg/dL (ref 0.50–1.10)
GFR, Est African American: 122 mL/min/{1.73_m2} (ref >=60)
GFR, Est Non African American: 105 mL/min/{1.73_m2} (ref >=60)
GLUCOSE: 120 mg/dL — AB (ref 65–99)
Globulin: 3.1 g/dL (calc) (ref 1.9–3.7)
Potassium: 4.2 mmol/L (ref 3.5–5.3)
SODIUM: 139 mmol/L (ref 135–146)
Total Protein: 7.1 g/dL (ref 6.1–8.1)

## 2018-07-03 LAB — MAGNESIUM: Magnesium: 1.4 mg/dL — ABNORMAL LOW (ref 1.5–2.5)

## 2018-07-03 LAB — CK: Total CK: 81 U/L (ref 29–143)

## 2018-07-03 LAB — VITAMIN B12: Vitamin B-12: 313 pg/mL (ref 200–1100)

## 2018-07-04 ENCOUNTER — Encounter: Payer: Self-pay | Admitting: *Deleted

## 2018-07-04 ENCOUNTER — Encounter

## 2018-07-04 ENCOUNTER — Encounter: Payer: Self-pay | Admitting: Cardiology

## 2018-07-04 ENCOUNTER — Ambulatory Visit: Payer: BLUE CROSS/BLUE SHIELD | Admitting: Cardiology

## 2018-07-04 VITALS — BP 156/92 | HR 91 | Ht 62.5 in | Wt 242.0 lb

## 2018-07-04 DIAGNOSIS — I340 Nonrheumatic mitral (valve) insufficiency: Secondary | ICD-10-CM | POA: Diagnosis not present

## 2018-07-04 DIAGNOSIS — I10 Essential (primary) hypertension: Secondary | ICD-10-CM

## 2018-07-04 DIAGNOSIS — E7800 Pure hypercholesterolemia, unspecified: Secondary | ICD-10-CM | POA: Diagnosis not present

## 2018-07-04 NOTE — Progress Notes (Signed)
Good morning Mary Clarke,  Your labs don't show any significant abnormalities.   Your magnesium is a little bit low. I would recommend starting an OTC magnesium supplement 500 mg daily. Your vitamin B12 is on the low end of normal. Recommend starting OTC B12 500 mcg daily  Your liver enzymes are a little increased. We will monitor this and recheck your levels in 1 month.

## 2018-07-04 NOTE — Patient Instructions (Signed)
Medication Instructions:   NO CHANGE  Testing/Procedures:  Your physician has requested that you have an echocardiogram. Echocardiography is a painless test that uses sound waves to create images of your heart. It provides your doctor with information about the size and shape of your heart and how well your heart's chambers and valves are working. This procedure takes approximately one hour. There are no restrictions for this procedure.    Follow-Up:  Your physician wants you to follow-up in: 6 MONTHS WITH DR Deborah Chalk You will receive a reminder letter in the mail two months in advance. If you don't receive a letter, please call our office to schedule the follow-up appointment.   If you need a refill on your cardiac medications before your next appointment, please call your pharmacy.

## 2018-07-04 NOTE — Addendum Note (Signed)
Addended by: Gena Fray E on: 07/04/2018 08:19 AM   Modules accepted: Orders

## 2018-07-13 DIAGNOSIS — Z87891 Personal history of nicotine dependence: Secondary | ICD-10-CM | POA: Diagnosis not present

## 2018-07-13 DIAGNOSIS — R51 Headache: Secondary | ICD-10-CM | POA: Diagnosis not present

## 2018-07-13 DIAGNOSIS — G43909 Migraine, unspecified, not intractable, without status migrainosus: Secondary | ICD-10-CM | POA: Diagnosis not present

## 2018-07-13 DIAGNOSIS — I10 Essential (primary) hypertension: Secondary | ICD-10-CM | POA: Diagnosis not present

## 2018-07-13 DIAGNOSIS — Z7951 Long term (current) use of inhaled steroids: Secondary | ICD-10-CM | POA: Diagnosis not present

## 2018-07-13 DIAGNOSIS — E119 Type 2 diabetes mellitus without complications: Secondary | ICD-10-CM | POA: Diagnosis not present

## 2018-07-17 ENCOUNTER — Encounter: Payer: Self-pay | Admitting: Physician Assistant

## 2018-07-17 ENCOUNTER — Ambulatory Visit: Payer: BLUE CROSS/BLUE SHIELD | Admitting: Physician Assistant

## 2018-07-17 VITALS — BP 168/94 | HR 96 | Ht 62.5 in | Wt 241.0 lb

## 2018-07-17 DIAGNOSIS — G4733 Obstructive sleep apnea (adult) (pediatric): Secondary | ICD-10-CM | POA: Diagnosis not present

## 2018-07-17 DIAGNOSIS — G43009 Migraine without aura, not intractable, without status migrainosus: Secondary | ICD-10-CM

## 2018-07-17 DIAGNOSIS — E1159 Type 2 diabetes mellitus with other circulatory complications: Secondary | ICD-10-CM | POA: Diagnosis not present

## 2018-07-17 DIAGNOSIS — I152 Hypertension secondary to endocrine disorders: Secondary | ICD-10-CM

## 2018-07-17 DIAGNOSIS — I10 Essential (primary) hypertension: Secondary | ICD-10-CM

## 2018-07-17 MED ORDER — HYDROCHLOROTHIAZIDE 25 MG PO TABS: 25.0000 mg | ORAL_TABLET | Freq: Every day | ORAL | 1 refills | Status: DC

## 2018-07-17 NOTE — Patient Instructions (Signed)
Start HCTZ daily.  If no improvement by Thursday increase diovan to 320( 2 tablets of what you already have) follow up in Friday.

## 2018-07-17 NOTE — Progress Notes (Signed)
Subjective:    Patient ID: Mary Clarke, female    DOB: December 20, 1970, 47 y.o.   MRN: [id number]  HPI  Pt is a 47 yo female with sever mitral regurgitation, T2DM,  HTN, presyncope, OSA, who has had a recent headache that sent her to the ER on 07/13/18. Her BP was elevated in ED at 170/98 but they gave her migraine cocktail of benadryl/toradol/phenergan/tylenol and an Rx of flexeril. BP improved to 146/80 without intervention. She was discharged with close follow up. Labs unremarkable.   9/25 her medication was changed from losartan to valsartan due to slightly elevated BP's. She does have OSA but not able to use CPAP due to mask. She does bring in manuel cuff to test against our machine.   Pt has never had a hx of migraines so she is concerned about this. She has dull headache today. No vision changes, CP, SOB, palpitations.   .. Active Ambulatory Problems    Diagnosis Date Noted  . Hypertriglyceridemia 02/20/2007  . PCOS (polycystic ovarian syndrome) 02/13/2007  . Hypertension associated with diabetes (HCC) 04/24/2015  . OSA (obstructive sleep apnea) 11/15/2016  . Type 2 diabetes mellitus without complication, without long-term current use of insulin (HCC) 11/17/2016  . Bilateral tinnitus 11/17/2016  . Moderate episode of recurrent major depressive disorder (HCC) 11/17/2016  . Hemarthrosis involving knee joint, right 12/18/2015  . Patellar dislocation, right, subsequent encounter 12/30/2015  . Hyperlipidemia associated with type 2 diabetes mellitus (HCC) 12/16/2016  . Vertigo 01/04/2017  . Moderate mitral regurgitation by prior echocardiogram 01/04/2017  . Severe obesity (BMI >= 40) (HCC) 01/19/2017  . Secondhand smoke exposure 01/19/2017  . Former heavy cigarette smoker (20-39 per day) 01/19/2017  . Abnormal nuclear stress test 04/06/2017  . Left-sided facial flushing, eyelid droop, pain 08/03/2017  . Elevated calcitonin level 08/15/2017  . Pre-syncope 12/18/2017  . Localized  swelling, mass or lump of neck 12/18/2017  . Leukomalacia, periventricular 12/18/2017  . Chronic pansinusitis 01/01/2018  . Cervical spondylosis without myelopathy 01/01/2018  . History of cardiac monitoring 03/15/2018  . Elevated blood pressure reading in office with diagnosis of hypertension 04/05/2018  . Grief reaction with prolonged bereavement 04/05/2018  . Postmenopausal 04/06/2018  . CMC arthritis, thumb, degenerative 04/06/2018  . Cramp of both lower extremities 07/02/2018  . Hypomagnesemia 07/04/2018   Resolved Ambulatory Problems    Diagnosis Date Noted  . CONSTIPATION NOS 02/13/2007  . Aortic ejection murmur 12/12/2016  . Amenorrhea 08/10/2017   Past Medical History:  Diagnosis Date  . Allergy   . Anxiety   . Chronic bronchitis (HCC)   . Depression   . Diabetes mellitus without complication (HCC)   . Heart murmur   . Hyperlipidemia   . Hypertension   . Severe mitral regurgitation by prior echocardiogram 01/04/2017  . Sleep apnea      Review of Systems    see HPI>  Objective:   Physical Exam  Constitutional: She is oriented to person, place, and time. She appears well-developed and well-nourished.  Obese.   HENT:  Head: Normocephalic and atraumatic.  Cardiovascular: Normal rate and regular rhythm.  Murmur heard. Pulmonary/Chest: Effort normal and breath sounds normal.  Neurological: She is alert and oriented to person, place, and time.  Psychiatric: She has a normal mood and affect. Her behavior is normal.          Assessment & Plan:  Marland KitchenMarland KitchenDiagnoses and all orders for this visit:  Hypertension associated with diabetes (HCC) -     hydrochlorothiazide (  HYDRODIURIL) 25 MG tablet; Take 1 tablet (25 mg total) by mouth daily.  OSA (obstructive sleep apnea)  Migraine without aura and without status migrainosus, not intractable   Continue on valsartan. Added HCTZ. Discussed SE's.Follow up BP on Friday. Home cuff is pretty close. Maybe just a tad higher  then our cuff. I feel like HA is related to BP. If no BP improvement by Thursday then will consider increasing valsartan to 320mg . Call back with BP readings. Follow up in office on Friday. If we get BP controlled and still having headaches will address migraines then.

## 2018-07-19 ENCOUNTER — Encounter: Payer: Self-pay | Admitting: Physician Assistant

## 2018-07-19 ENCOUNTER — Telehealth: Payer: Self-pay

## 2018-07-19 NOTE — Telephone Encounter (Signed)
Mary Clarke called this morning. She was seen in our office on 07/17/2018 and she said she was put on blood pressure medication. She wanted me to let you know that the medication is working very well, but now her blood pressure is low. She just wanted to know if she should continue to take the medication? She is scheduled for a nurse visit tomorrow morning for a blood pressure check. Thanks!

## 2018-07-19 NOTE — Telephone Encounter (Signed)
Continue to take until I can see what "low" is. We can address in morning.

## 2018-07-20 ENCOUNTER — Ambulatory Visit (INDEPENDENT_AMBULATORY_CARE_PROVIDER_SITE_OTHER): Payer: BLUE CROSS/BLUE SHIELD | Admitting: Sports Medicine

## 2018-07-20 VITALS — BP 115/60 | HR 74 | Wt 236.0 lb

## 2018-07-20 DIAGNOSIS — E1159 Type 2 diabetes mellitus with other circulatory complications: Secondary | ICD-10-CM | POA: Diagnosis not present

## 2018-07-20 DIAGNOSIS — I10 Essential (primary) hypertension: Secondary | ICD-10-CM | POA: Diagnosis not present

## 2018-07-20 DIAGNOSIS — I152 Hypertension secondary to endocrine disorders: Secondary | ICD-10-CM

## 2018-07-20 NOTE — Progress Notes (Signed)
   Subjective:    Patient ID: Mary Clarke, female    DOB: 18-Aug-1971, 47 y.o.   MRN: [id number]  HPI Patient presents to office today for BP check. Is not taking the HCTZ that was prescribed at last office visit due to it making her feel very disoriented. Pt still complains of H/A some dizziness. Denies chest pain, palpitationss or SOB. Home reading of blood pressure this morning was 101/75. KG LPN   Review of Systems     Objective:   Physical Exam        Assessment & Plan:  Advised patient to try taking some Excedrin Migraine OTC to see if this helps the headache and if not then call office back on Monday. KG LPN

## 2018-07-20 NOTE — Assessment & Plan Note (Signed)
Blood pressure is well controlled, continue off of hydrochlorothiazide and take only valsartan. Continue to check blood pressure readings at home.

## 2018-07-21 DIAGNOSIS — E119 Type 2 diabetes mellitus without complications: Secondary | ICD-10-CM | POA: Diagnosis not present

## 2018-07-21 DIAGNOSIS — R51 Headache: Secondary | ICD-10-CM | POA: Diagnosis not present

## 2018-07-21 DIAGNOSIS — H70899 Other mastoiditis and related conditions, unspecified ear: Secondary | ICD-10-CM | POA: Diagnosis not present

## 2018-07-21 DIAGNOSIS — Z7984 Long term (current) use of oral hypoglycemic drugs: Secondary | ICD-10-CM | POA: Diagnosis not present

## 2018-07-21 DIAGNOSIS — Z7982 Long term (current) use of aspirin: Secondary | ICD-10-CM | POA: Diagnosis not present

## 2018-07-21 DIAGNOSIS — Z79899 Other long term (current) drug therapy: Secondary | ICD-10-CM | POA: Diagnosis not present

## 2018-07-21 DIAGNOSIS — Z87891 Personal history of nicotine dependence: Secondary | ICD-10-CM | POA: Diagnosis not present

## 2018-07-21 DIAGNOSIS — E860 Dehydration: Secondary | ICD-10-CM | POA: Diagnosis not present

## 2018-07-21 DIAGNOSIS — R9431 Abnormal electrocardiogram [ECG] [EKG]: Secondary | ICD-10-CM | POA: Diagnosis not present

## 2018-07-21 DIAGNOSIS — M791 Myalgia, unspecified site: Secondary | ICD-10-CM | POA: Diagnosis not present

## 2018-07-21 DIAGNOSIS — G4489 Other headache syndrome: Secondary | ICD-10-CM | POA: Diagnosis not present

## 2018-07-21 DIAGNOSIS — B349 Viral infection, unspecified: Secondary | ICD-10-CM | POA: Diagnosis not present

## 2018-07-21 DIAGNOSIS — Z888 Allergy status to other drugs, medicaments and biological substances status: Secondary | ICD-10-CM | POA: Diagnosis not present

## 2018-07-21 DIAGNOSIS — R531 Weakness: Secondary | ICD-10-CM | POA: Diagnosis not present

## 2018-07-21 DIAGNOSIS — R42 Dizziness and giddiness: Secondary | ICD-10-CM | POA: Diagnosis not present

## 2018-07-21 DIAGNOSIS — R0902 Hypoxemia: Secondary | ICD-10-CM | POA: Diagnosis not present

## 2018-07-24 ENCOUNTER — Ambulatory Visit (INDEPENDENT_AMBULATORY_CARE_PROVIDER_SITE_OTHER): Payer: BLUE CROSS/BLUE SHIELD | Admitting: Physician Assistant

## 2018-07-24 VITALS — BP 119/80 | HR 98 | Temp 98.1°F | Wt 237.0 lb

## 2018-07-24 DIAGNOSIS — R55 Syncope and collapse: Secondary | ICD-10-CM

## 2018-07-24 DIAGNOSIS — G500 Trigeminal neuralgia: Secondary | ICD-10-CM | POA: Diagnosis not present

## 2018-07-24 DIAGNOSIS — R740 Nonspecific elevation of levels of transaminase and lactic acid dehydrogenase [LDH]: Secondary | ICD-10-CM

## 2018-07-24 DIAGNOSIS — R7401 Elevation of levels of liver transaminase levels: Secondary | ICD-10-CM

## 2018-07-24 DIAGNOSIS — F331 Major depressive disorder, recurrent, moderate: Secondary | ICD-10-CM

## 2018-07-24 DIAGNOSIS — Z09 Encounter for follow-up examination after completed treatment for conditions other than malignant neoplasm: Secondary | ICD-10-CM | POA: Diagnosis not present

## 2018-07-24 DIAGNOSIS — R42 Dizziness and giddiness: Secondary | ICD-10-CM

## 2018-07-24 DIAGNOSIS — E878 Other disorders of electrolyte and fluid balance, not elsewhere classified: Secondary | ICD-10-CM

## 2018-07-24 DIAGNOSIS — H6123 Impacted cerumen, bilateral: Secondary | ICD-10-CM | POA: Diagnosis not present

## 2018-07-24 DIAGNOSIS — G4733 Obstructive sleep apnea (adult) (pediatric): Secondary | ICD-10-CM

## 2018-07-24 MED ORDER — NORTRIPTYLINE HCL 25 MG PO CAPS: 50.0000 mg | ORAL_CAPSULE | Freq: Every day | ORAL | 3 refills | Status: DC

## 2018-07-24 NOTE — Progress Notes (Signed)
HPI:                                                                Mary Clarke is a 47 y.o. female who presents to [name]: Primary Care Sports Medicine today for hospital f/u  Pleasant 47 yo F with PMH DM2, moderate MR, HTN, OSA, PCOS, and TN was treated at [facility] ED on 07/21/18 for acute dehydration. She was transported to ED via EMS after a near syncopal episode at work.  Reports while she was at her job at [employer] 3 days ago she developed sudden onset diaphoresis, room spinning, "feeling in slow motion" and altered level of consciousness. States she did not lose consciousness, but "I all but blacked out." denies associated chest pain, palpitations, irregular heart beats, or dyspnea. She has had several episodes like this before, but this was the worse one. Following this episode she had recurrent left-sided facial flushing and facial pain. She is followed by Dr. Rosalyn Gess for TN.  States she had eaten breakfast and drank a bottle of water that day, so she does not think she was dehydrated. Reports she typically drinks about 4 standard sized water bottles and 1.5 diet sodas per day. She does not skip meals. Her job does involve prolonged standing.  She also has severe OSA. Due to mask discomfort, she has not used her CPAP in 9 months. She would like to try a nasal mask with a chin strap because she is a mouth breather. Sleep study was performed at [facility] approx 3 years ago. She uses Nurse, mental health.  She is also endorsing severe depression. States "I just dont feel normal." She is tearful most days "for no reason." She states she is very lonely. This is complicated by grief over loss of her brothers and best friend several years ago. She is following up weekly with a counselor in [place].   Past Medical History:  Diagnosis Date  . Allergy   . Anxiety   . Chronic bronchitis (HCC)    per pt stopped when I quit smoking  . Depression   .  Diabetes mellitus without complication (HCC)   . Elevated calcitonin level 08/15/2017  . Heart murmur   . Hyperlipidemia   . Hypertension   . PCOS (polycystic ovarian syndrome)   . Severe mitral regurgitation by prior echocardiogram 01/04/2017  . Sleep apnea    Past Surgical History:  Procedure Laterality Date  . CHOLECYSTECTOMY    . RIGHT/LEFT HEART CATH AND CORONARY ANGIOGRAPHY Right 04/07/2017   Procedure: Right/Left Heart Cath and Coronary Angiography;  Surgeon: Lyn Records, MD;  Location: [facility] INVASIVE CV LAB;  Service: Cardiovascular;  Laterality: Right;  . TEE WITHOUT CARDIOVERSION N/A 04/07/2017   Procedure: TRANSESOPHAGEAL ECHOCARDIOGRAM (TEE);  Surgeon: Lewayne Bunting, MD;  Location: [facility] ENDOSCOPY;  Service: Cardiovascular;  Laterality: N/A;   Social History   Tobacco Use  . Smoking status: Former Smoker    Packs/day: 1.00    Years: 15.00    Pack years: 15.00    Types: Cigarettes    Last attempt to quit: 06/11/2015    Years since quitting: 3.1  . Smokeless tobacco: Never Used  . Tobacco comment: 2nd hand smoke exposure current  Substance  Use Topics  . Alcohol use: No   family history includes Aneurysm in her brother; Diabetes in her brother and brother; Heart attack (age of onset: 94) in her brother; Heart attack (age of onset: 1) in her father; Heart attack (age of onset: 66) in her mother; Hypertension in her brother and mother; Lung cancer in her brother; Stroke in her brother; Thyroid disease in her brother.    ROS: Review of Systems  HENT: Positive for tinnitus.   Neurological: Positive for dizziness, sensory change, weakness and headaches. Negative for focal weakness, seizures and loss of consciousness.  Psychiatric/Behavioral: Positive for depression. Negative for substance abuse and suicidal ideas.  All other systems reviewed and are negative.    Medications: Current Outpatient Medications  Medication Sig Dispense Refill  . aspirin 81 MG chewable tablet  Chew 81 mg by mouth once.    Marland Kitchen atorvastatin (LIPITOR) 40 MG tablet Take 1 tablet (40 mg total) by mouth at bedtime. 90 tablet 1  . Evolocumab (REPATHA SURECLICK) 140 MG/ML SOAJ Inject 140 mg into the skin every 14 (fourteen) days. 2 pen 12  . fluticasone (FLONASE) 50 MCG/ACT nasal spray Place 1 spray into both nostrils daily as needed for allergies or rhinitis.    Marland Kitchen levocetirizine (XYZAL) 5 MG tablet Take 1 tablet by mouth daily as needed (allergies).     . magnesium gluconate (MAGONATE) 500 MG tablet Take 500 mg by mouth daily.    . metFORMIN (GLUCOPHAGE) 1000 MG tablet Take 1 tablet (1,000 mg total) by mouth 2 (two) times daily with a meal. 90 tablet 1  . NONFORMULARY OR COMPOUNDED ITEM Resmed chin strap For nightly use with CPAP mask 1 each 0  . NONFORMULARY OR COMPOUNDED ITEM CPAP mask AirFitT N10 for Her For nightly use with CPAP machine 1 each 0  . nortriptyline (PAMELOR) 25 MG capsule Take 2 capsules (50 mg total) by mouth at bedtime. 60 capsule 3  . omega-3 acid ethyl esters (LOVAZA) 1 g capsule Take 2 capsules (2 g total) by mouth 2 (two) times daily. 120 capsule 3  . valsartan (DIOVAN) 160 MG tablet Take 1 tablet (160 mg total) by mouth daily. 90 tablet 1  . vitamin B-12 (CYANOCOBALAMIN) 500 MCG tablet Take 500 mcg by mouth daily.     No current facility-administered medications for this visit.    Allergies  Allergen Reactions  . Fenofibrate Other (See Comments)    Facial flushing       Objective:  BP 119/80   Pulse 98   Temp 98.1 F (36.7 C)   Wt 237 lb (107.5 kg)   LMP 01/22/2016 (Approximate)   SpO2 97%   BMI 42.66 kg/m  Gen:  alert, not ill-appearing, no distress, appropriate for age, obese female HEENT: head normocephalic without obvious abnormality, conjunctiva and cornea clear, wearing glasses, trachea midline Pulm: Normal work of breathing, normal phonation, clear to auscultation bilaterally, no wheezes, rales or rhonchi CV: Normal rate, regular rhythm, s1  and s2 distinct, no murmurs, clicks or rubs  Neuro: alert and oriented x 3, no tremor MSK: extremities atraumatic, normal gait and station, no peripheral edema Skin: intact, no rashes on exposed skin, no jaundice, no cyanosis Psych: well-groomed, cooperative, good eye contact, euthymic mood, affect mood-congruent, speech is articulate, and thought processes clear and goal-directed  CT HEAD WO IV CONTRAST  Narrative:  INDICATION: headache. COMPARISON: MRI of the brain dated November 06, 2017  TECHNIQUE: CT HEAD WO IV CONTRAST; Multiple axial CT images obtained  from the skull base to vertex - No administration of intravenous contrast..  FINDINGS: The study is mildly degraded due to patient motion artifact. Brain Parenchyma: No parenchymal hemorrhage. A decreased density in the left high parietal subcortical white matter and diffusely in the periventricular white matter most prominently about the posterior body of the left lateral ventricle. No intraparenchymal mass  evident.  Ventricles and Extra-axial Spaces:  No hydrocephalus. No midline shift or mass effect. Visualized paranasal sinuses and mastoid air cells:  Paranasal sinuses are clear with slight fluid/mucosal thickening of the mastoid air cells. Calvarium / Soft tissues:  No evidence of skull fracture. Hyperostosis frontalis internus.  Impression:  IMPRESSION: 1. No acute intracranial abnormality.  2. Mild apparent old ischemic white matter disease. 3. Mild mastoiditis.   Results for orders placed or performed in visit on 07/24/18 (from the past 72 hour(s))  Renal Function Panel     Status: Abnormal   Collection Time: 07/24/18  2:35 PM  Result Value Ref Range   Glucose, Bld 126 (H) 65 - 99 mg/dL    Comment: .            Fasting reference interval . For someone without known diabetes, a glucose value >125 mg/dL indicates that they may have diabetes and this should be confirmed with a follow-up test. .    BUN 23 7 - 25  mg/dL   Creat 9.14 7.82 - 9.56 mg/dL   BUN/Creatinine Ratio NOT APPLICABLE 6 - 22 (calc)   Sodium 137 135 - 146 mmol/L   Potassium 4.6 3.5 - 5.3 mmol/L   Chloride 99 98 - 110 mmol/L   CO2 25 20 - 32 mmol/L   Calcium 10.0 8.6 - 10.2 mg/dL   Phosphorus 4.0 2.5 - 4.5 mg/dL   Albumin 4.5 3.6 - 5.1 g/dL  Hepatic function panel     Status: Abnormal   Collection Time: 07/24/18  2:35 PM  Result Value Ref Range   Total Protein 7.6 6.1 - 8.1 g/dL   Albumin 4.5 3.6 - 5.1 g/dL   Globulin 3.1 1.9 - 3.7 g/dL (calc)   AG Ratio 1.5 1.0 - 2.5 (calc)   Total Bilirubin 0.4 0.2 - 1.2 mg/dL   Bilirubin, Direct 0.1 0.0 - 0.2 mg/dL   Indirect Bilirubin 0.3 0.2 - 1.2 mg/dL (calc)   Alkaline phosphatase (APISO) 62 33 - 115 U/L   AST 48 (H) 10 - 35 U/L   ALT 68 (H) 6 - 29 U/L   No results found.    Assessment and Plan: 47 y.o. female with   .Mary Clarke was seen today for hospitalization follow-up.  Diagnoses and all orders for this visit:  Hospital discharge follow-up  Electrolyte abnormality -     Renal Function Panel  Transaminitis -     Hepatic function panel -     US ABDOMEN LIMITED RUQ; Future  Trigeminal neuralgia -     nortriptyline (PAMELOR) 25 MG capsule; Take 2 capsules (50 mg total) by mouth at bedtime.  Moderate episode of recurrent major depressive disorder (HCC) -     nortriptyline (PAMELOR) 25 MG capsule; Take 2 capsules (50 mg total) by mouth at bedtime.  Postural dizziness with presyncope  OSA (obstructive sleep apnea) -     NONFORMULARY OR COMPOUNDED ITEM; Resmed chin strap For nightly use with CPAP mask -     NONFORMULARY OR COMPOUNDED ITEM; CPAP mask AirFitT N10 for Her For nightly use with CPAP machine   - personally reviewed lab  results in Care Everywhere, showing mutliple electrolyte abnormalities and mild AKI. Rechecking metabolic panel - personally reviewed [facility] report from 07/23/18, which showed chronic white matter disease and mucosal thickening of mastoid air  cells - recommended close follow-up with Dr. Rosalyn Gess  - increasing Nortriptyline from 30 mg to 50 mg QHS for both TN and MDD  Patient education and anticipatory guidance given Patient agrees with treatment plan Follow-up in 1 month for depression or sooner as needed if symptoms worsen or fail to improve  Levonne Hubert PA-C

## 2018-07-24 NOTE — Patient Instructions (Addendum)
Contact Dr. Rosalyn Gess [phone]

## 2018-07-25 LAB — HEPATIC FUNCTION PANEL
AG RATIO: 1.5 (calc) (ref 1.0–2.5)
ALBUMIN MSPROF: 4.5 g/dL (ref 3.6–5.1)
ALT: 68 U/L — AB (ref 6–29)
AST: 48 U/L — ABNORMAL HIGH (ref 10–35)
Alkaline phosphatase (APISO): 62 U/L (ref 33–115)
BILIRUBIN TOTAL: 0.4 mg/dL (ref 0.2–1.2)
Bilirubin, Direct: 0.1 mg/dL (ref 0.0–0.2)
GLOBULIN: 3.1 g/dL (ref 1.9–3.7)
Indirect Bilirubin: 0.3 mg/dL (calc) (ref 0.2–1.2)
Total Protein: 7.6 g/dL (ref 6.1–8.1)

## 2018-07-25 LAB — RENAL FUNCTION PANEL
Albumin: 4.5 g/dL (ref 3.6–5.1)
BUN: 23 mg/dL (ref 7–25)
CO2: 25 mmol/L (ref 20–32)
CREATININE: 0.84 mg/dL (ref 0.50–1.10)
Calcium: 10 mg/dL (ref 8.6–10.2)
Chloride: 99 mmol/L (ref 98–110)
Glucose, Bld: 126 mg/dL — ABNORMAL HIGH (ref 65–99)
PHOSPHORUS: 4 mg/dL (ref 2.5–4.5)
Potassium: 4.6 mmol/L (ref 3.5–5.3)
Sodium: 137 mmol/L (ref 135–146)

## 2018-07-26 ENCOUNTER — Encounter: Payer: Self-pay | Admitting: Physician Assistant

## 2018-07-26 DIAGNOSIS — R55 Syncope and collapse: Secondary | ICD-10-CM

## 2018-07-26 DIAGNOSIS — G500 Trigeminal neuralgia: Secondary | ICD-10-CM | POA: Insufficient documentation

## 2018-07-26 DIAGNOSIS — R42 Dizziness and giddiness: Secondary | ICD-10-CM | POA: Insufficient documentation

## 2018-07-26 DIAGNOSIS — R7401 Elevation of levels of liver transaminase levels: Secondary | ICD-10-CM | POA: Insufficient documentation

## 2018-07-26 DIAGNOSIS — R740 Nonspecific elevation of levels of transaminase and lactic acid dehydrogenase [LDH]: Secondary | ICD-10-CM

## 2018-07-26 MED ORDER — NONFORMULARY OR COMPOUNDED ITEM: 0 refills | Status: DC

## 2018-07-27 ENCOUNTER — Ambulatory Visit (INDEPENDENT_AMBULATORY_CARE_PROVIDER_SITE_OTHER): Payer: BLUE CROSS/BLUE SHIELD

## 2018-07-27 DIAGNOSIS — K760 Fatty (change of) liver, not elsewhere classified: Secondary | ICD-10-CM

## 2018-07-27 DIAGNOSIS — R740 Nonspecific elevation of levels of transaminase and lactic acid dehydrogenase [LDH]: Principal | ICD-10-CM

## 2018-07-27 DIAGNOSIS — R7401 Elevation of levels of liver transaminase levels: Secondary | ICD-10-CM

## 2018-07-30 ENCOUNTER — Ambulatory Visit (INDEPENDENT_AMBULATORY_CARE_PROVIDER_SITE_OTHER): Payer: BLUE CROSS/BLUE SHIELD | Admitting: Licensed Clinical Social Worker

## 2018-07-30 DIAGNOSIS — F4329 Adjustment disorder with other symptoms: Secondary | ICD-10-CM | POA: Diagnosis not present

## 2018-07-30 DIAGNOSIS — F4381 Prolonged grief disorder: Secondary | ICD-10-CM

## 2018-07-30 DIAGNOSIS — F331 Major depressive disorder, recurrent, moderate: Secondary | ICD-10-CM

## 2018-07-30 NOTE — Progress Notes (Signed)
THERAPIST PROGRESS NOTE  Session Time: 10:05 AM to 11:00 AM  Participation Level: Active  Behavioral Response: CasualAlertDysphoric and tearful  Type of Therapy: Individual Therapy  Treatment Goals addressed:  patient processed through grief, decrease depressive symptoms  Interventions: CBT, Solution Focused, Strength-based, Supportive and Other: process of grief  Summary: Mary Clarke is a 47 y.o. female who presents with major depressive disorder, recurrent, moderate, prolonged grief reaction.    Suicidal/Homicidal: No  Therapist Response: Patient shared two recent episodes where she went to ER. Both related to migraine and high blood pressures, second time, two weeks ago, ambulance took to hospital. Shella Maxim has had these episodes occasionally. Describes other symptoms including cold, clammy hands, sweat, felt her heart racing, couldn't concentrate, thought that everything was closing in on her, felt like she "was going to hit the floor", vitals were normal. Electrolytes were off and think she was dehydrated, patient believes it was "anxiety attack", had similar episode 3 months ago.  Her doctor has increased nortriptyline, patient shares she does not notice a difference.  Patient shares being it up and down with grief symptoms.  Patient shares this Wednesday having a day or nothing felt right, woke up crying and went to bed crying nothing.  Patient became tearful and describes her fears of things going on with her physically, recognize this is trigger for her anxiety.  Shares "nobody explained why I had the spell, I have had this before". She wants to know what to do next time. Shares that her middle sister has issues with panic, and daughter, older brother had panic too. Also shares she has health issues that scare her, found a heart murmur two years ago, scheduled for second ecocardiagram second because of heart murmur, doctor has outsold her that the leak is worse, thinks this will have to  lead to major heart surgery, has fatty liver that causes being overweight, HTN, high cholesterol. Has issues because of physical issues of joint pain and ankle swelling, especially with hands.  Identified health issues is probably 1 of the underlying triggers for panic, also unresolved grief issues the go back to the loss of her parents as part of underlying trigger.  Patient shares having more losses the most people she knows, loss over the years has played a part, lost dad at 8 lost mom when she was 47.  Therapist encourage patient to help her feel she have some control of her physical issues by taking steps to be healthier with diet and exercise and patient agrees.  Educated patient on panic see below and discussed aspects that can slow recovery from grief related to cognitions, also recognize posttraumatic growth.   Assess patient current functioning per report.  Identified patient's recent episodes where she went to hospital could be related to panic.  Presented worksheet tidal "self-help for anxiety" to help explain that panic is her fight or flight response, and with panic we can miss upperhand something as dangerous.  Identified patient's thoughts that feeding to anxiety, that the worst possible scenario is going to happen, and challenged patient's thoughts, noted catastrophize again fortune telling, explained to patient to ask herself is this factor opinion?,  What of my reacting to, MI and overestimating the danger to begin to challenge her thoughts, help her challenge automatic thoughts to help her in developing accurate and helpful perspective.  Identified trigger as concerns about her health, therapist work with patient on accurate perspective on her health, encouraged helpful coping and taking some control through  diet and exercise and how that will help with her mental health, also identified grief as a trigger.  Identified thought distortion of permanence that the after shocks will last forever as  impacting her ability to recover.  Discussed posttraumatic growth for patient to consider how are believes and actions can shape direction of recovery from grief.  Provided strength based on supportive intervention  Plan: Return again in 3 weeks.2.  Patient scheduled to see psychiatrist to review symptoms assess for panic.3.  Therapist continue to work with patient on processing through grief, coping with anxiety  Diagnosis: Axis I:  major depressive disorder, recurrent, moderate, prolonged grief reaction    Axis II: No diagnosis    Coolidge Breeze, LCSW 07/30/2018

## 2018-08-01 ENCOUNTER — Other Ambulatory Visit: Payer: Self-pay | Admitting: Physician Assistant

## 2018-08-01 DIAGNOSIS — E1169 Type 2 diabetes mellitus with other specified complication: Secondary | ICD-10-CM

## 2018-08-01 DIAGNOSIS — E781 Pure hyperglyceridemia: Secondary | ICD-10-CM

## 2018-08-10 ENCOUNTER — Ambulatory Visit (HOSPITAL_BASED_OUTPATIENT_CLINIC_OR_DEPARTMENT_OTHER)
Admission: RE | Admit: 2018-08-10 | Discharge: 2018-08-10 | Disposition: A | Discharge status: Home / Self Care | Payer: BLUE CROSS/BLUE SHIELD | Source: Ambulatory Visit | Attending: Cardiology | Admitting: Cardiology

## 2018-08-10 DIAGNOSIS — I340 Nonrheumatic mitral (valve) insufficiency: Secondary | ICD-10-CM | POA: Insufficient documentation

## 2018-08-10 DIAGNOSIS — I10 Essential (primary) hypertension: Secondary | ICD-10-CM | POA: Insufficient documentation

## 2018-08-10 DIAGNOSIS — E785 Hyperlipidemia, unspecified: Secondary | ICD-10-CM | POA: Insufficient documentation

## 2018-08-10 DIAGNOSIS — E119 Type 2 diabetes mellitus without complications: Secondary | ICD-10-CM | POA: Insufficient documentation

## 2018-08-10 NOTE — Progress Notes (Signed)
  Echocardiogram 2D Echocardiogram has been performed.  Cherissa Hook T Rockney Grenz 08/10/2018, 2:12 PM

## 2018-08-15 DIAGNOSIS — R51 Headache: Secondary | ICD-10-CM | POA: Diagnosis not present

## 2018-08-15 DIAGNOSIS — I10 Essential (primary) hypertension: Secondary | ICD-10-CM | POA: Diagnosis not present

## 2018-08-15 DIAGNOSIS — E1159 Type 2 diabetes mellitus with other circulatory complications: Secondary | ICD-10-CM | POA: Diagnosis not present

## 2018-08-20 ENCOUNTER — Encounter: Payer: Self-pay | Admitting: Physician Assistant

## 2018-08-20 ENCOUNTER — Other Ambulatory Visit: Payer: Self-pay

## 2018-08-20 ENCOUNTER — Ambulatory Visit (INDEPENDENT_AMBULATORY_CARE_PROVIDER_SITE_OTHER): Payer: BLUE CROSS/BLUE SHIELD | Admitting: Physician Assistant

## 2018-08-20 ENCOUNTER — Ambulatory Visit (INDEPENDENT_AMBULATORY_CARE_PROVIDER_SITE_OTHER): Payer: BLUE CROSS/BLUE SHIELD | Admitting: Licensed Clinical Social Worker

## 2018-08-20 VITALS — BP 126/75 | HR 84 | Wt 236.0 lb

## 2018-08-20 DIAGNOSIS — K760 Fatty (change of) liver, not elsewhere classified: Secondary | ICD-10-CM

## 2018-08-20 DIAGNOSIS — F4329 Adjustment disorder with other symptoms: Secondary | ICD-10-CM | POA: Diagnosis not present

## 2018-08-20 DIAGNOSIS — F329 Major depressive disorder, single episode, unspecified: Secondary | ICD-10-CM

## 2018-08-20 DIAGNOSIS — F331 Major depressive disorder, recurrent, moderate: Secondary | ICD-10-CM

## 2018-08-20 DIAGNOSIS — F4321 Adjustment disorder with depressed mood: Secondary | ICD-10-CM | POA: Diagnosis not present

## 2018-08-20 DIAGNOSIS — G500 Trigeminal neuralgia: Secondary | ICD-10-CM

## 2018-08-20 DIAGNOSIS — G4733 Obstructive sleep apnea (adult) (pediatric): Secondary | ICD-10-CM

## 2018-08-20 DIAGNOSIS — F411 Generalized anxiety disorder: Secondary | ICD-10-CM | POA: Diagnosis not present

## 2018-08-20 DIAGNOSIS — F4381 Prolonged grief disorder: Secondary | ICD-10-CM

## 2018-08-20 DIAGNOSIS — F32A Depression, unspecified: Secondary | ICD-10-CM | POA: Insufficient documentation

## 2018-08-20 MED ORDER — NONFORMULARY OR COMPOUNDED ITEM: 0 refills | Status: DC

## 2018-08-20 MED ORDER — DULOXETINE HCL 20 MG PO CPEP: ORAL_CAPSULE | ORAL | 1 refills | Status: DC

## 2018-08-20 MED ORDER — NORTRIPTYLINE HCL 10 MG PO CAPS: ORAL_CAPSULE | ORAL | 0 refills | Status: DC

## 2018-08-20 NOTE — Patient Instructions (Signed)
Nonalcoholic Fatty Liver Disease Diet Nonalcoholic fatty liver disease is a condition that causes fat to accumulate in and around the liver. The disease makes it harder for the liver to work the way that it should. Following a healthy diet can help to keep nonalcoholic fatty liver disease under control. It can also help to prevent or improve conditions that are associated with the disease, such as heart disease, diabetes, high blood pressure, and abnormal cholesterol levels. Along with regular exercise, this diet:  Promotes weight loss.  Helps to control blood sugar levels.  Helps to improve the way that the body uses insulin.  What do I need to know about this diet?  Use the glycemic index (GI) to plan your meals. The index tells you how quickly a food will raise your blood sugar. Choose low-GI foods. These foods take a longer time to raise blood sugar.  Keep track of how many calories you take in. Eating the right amount of calories will help you to achieve a healthy weight.  You may want to follow a Mediterranean diet. This diet includes a lot of vegetables, lean meats or fish, whole grains, fruits, and healthy oils and fats. What foods can I eat? Grains Whole grains, such as whole-wheat or whole-grain breads, crackers, tortillas, cereals, and pasta. Stone-ground whole wheat. Pumpernickel bread. Unsweetened oatmeal. Bulgur. Barley. Quinoa. Brown or wild rice. Corn or whole-wheat flour tortillas. Vegetables Lettuce. Spinach. Peas. Beets. Cauliflower. Cabbage. Broccoli. Carrots. Tomatoes. Squash. Eggplant. Herbs. Peppers. Onions. Cucumbers. Brussels sprouts. Yams and sweet potatoes. Beans. Lentils. Fruits Bananas. Apples. Oranges. Grapes. Papaya. Mango. Pomegranate. Kiwi. Grapefruit. Cherries. Meats and Other Protein Sources Seafood and shellfish. Lean meats. Poultry. Tofu. Dairy Low-fat or fat-free dairy products, such as yogurt, cottage cheese, and cheese. Beverages Water. Sugar-free  drinks. Tea. Coffee. Low-fat or skim milk. Milk alternatives, such as soy or almond milk. Real fruit juice. Condiments Mustard. Relish. Low-fat, low-sugar ketchup and barbecue sauce. Low-fat or fat-free mayonnaise. Sweets and Desserts Sugar-free sweets. Fats and Oils Avocado. Canola or olive oil. Nuts and nut butters. Seeds. The items listed above may not be a complete list of recommended foods or beverages. Contact your dietitian for more options. What foods are not recommended? Palm oil and coconut oil. Processed foods. Fried foods. Sweetened drinks, such as sweet tea, milkshakes, snow cones, iced sweet drinks, and sodas. Alcohol. Sweets. Foods that contain a lot of salt or sodium. The items listed above may not be a complete list of foods and beverages to avoid. Contact your dietitian for more information. This information is not intended to replace advice given to you by your health care provider. Make sure you discuss any questions you have with your health care provider. Document Released: 02/10/2015 Document Revised: 03/03/2016 Document Reviewed: 10/21/2014 Elsevier Interactive Patient Education  2018 Else[employer] Inc[employer]  

## 2018-08-20 NOTE — Progress Notes (Signed)
HPI:                                                                Mary Clarke is a 47 y.o. female who presents to [name] today for depression follow-up  Since last OV: Neurology started her on Lyrica 50 mg QHS for TN/facial pain.  She has had persistent mild transaminitis. US showed severe diffuse fatty infiltration.  She endorses depressed mood or anhedonia most days. Reports she is lonely and endorses unresolved grief from loss of brothers and best friend several years ago. She is currently taking Pamelor 50 mg nightly for facial pain. The dose was increased from 30 to 50 mg at last OV to see if this would have a positive effect on mood. She reports it has not and she noticed no difference She is still following with counseling 1-2 times per month and has an appt today. Denies symptoms of mania/hypomania. Denies suicidal thinking or history of self harm. Denies auditory/visual hallucinations. No prior psychiatric hospitalizations No substance use  Depression screen [facility] 2/9 08/20/2018 04/05/2018 08/03/2017 11/17/2016  Decreased Interest 2 0 1 1  Down, Depressed, Hopeless 2 2 2 2   PHQ - 2 Score 4 2 3 3   Altered sleeping 3 3 2 3   Tired, decreased energy 3 3 3 3   Change in appetite 0 0 1 0  Feeling bad or failure about yourself  1 0 0 0  Trouble concentrating 1 0 1 1  Moving slowly or fidgety/restless 0 2 0 0  Suicidal thoughts 0 0 0 0  PHQ-9 Score 12 10 10 10   Difficult doing work/chores Very difficult Very difficult - -    GAD 7 : Generalized Anxiety Score 08/20/2018 04/05/2018 11/17/2016  Nervous, Anxious, on Edge 2 1 1   Control/stop worrying 0 0 2  Worry too much - different things 0 0 2  Trouble relaxing 3 3 3   Restless 1 2 0  Easily annoyed or irritable 3 1 3   Afraid - awful might happen 0 0 0  Total GAD 7 Score 9 7 11   Anxiety Difficulty Somewhat difficult Very difficult -      Past Medical History:  Diagnosis  Date  . Allergy   . Anxiety   . Chronic bronchitis (HCC)    per pt stopped when I quit smoking  . Depression   . Diabetes mellitus without complication (HCC)   . Elevated calcitonin level 08/15/2017  . Heart murmur   . Hyperlipidemia   . Hypertension   . PCOS (polycystic ovarian syndrome)   . Severe mitral regurgitation by prior echocardiogram 01/04/2017  . Sleep apnea    Past Surgical History:  Procedure Laterality Date  . CHOLECYSTECTOMY    . RIGHT/LEFT HEART CATH AND CORONARY ANGIOGRAPHY Right 04/07/2017   Procedure: Right/Left Heart Cath and Coronary Angiography;  Surgeon: Lyn Records, MD;  Location: [facility] INVASIVE CV LAB;  Service: Cardiovascular;  Laterality: Right;  . TEE WITHOUT CARDIOVERSION N/A 04/07/2017   Procedure: TRANSESOPHAGEAL ECHOCARDIOGRAM (TEE);  Surgeon: Lewayne Bunting, MD;  Location: [facility] ENDOSCOPY;  Service: Cardiovascular;  Laterality: N/A;   Social History   Tobacco Use  . Smoking status: Former Smoker    Packs/day: 1.00    Years:  15.00    Pack years: 15.00    Types: Cigarettes    Last attempt to quit: 06/11/2015    Years since quitting: 3.1  . Smokeless tobacco: Never Used  . Tobacco comment: 2nd hand smoke exposure current  Substance Use Topics  . Alcohol use: No   family history includes Aneurysm in her brother; Diabetes in her brother and brother; Heart attack (age of onset: 37) in her brother; Heart attack (age of onset: 67) in her father; Heart attack (age of onset: 54) in her mother; Hypertension in her brother and mother; Lung cancer in her brother; Stroke in her brother; Thyroid disease in her brother.    ROS: negative except as noted in the HPI  Medications: Current Outpatient Medications  Medication Sig Dispense Refill  . aspirin 81 MG chewable tablet Chew 81 mg by mouth once.    Marland Kitchen atorvastatin (LIPITOR) 40 MG tablet Take 1 tablet (40 mg total) by mouth at bedtime. 90 tablet 1  . DULoxetine (CYMBALTA) 20 MG capsule Take 1 capsule (20 mg  total) by mouth at bedtime for 7 days, THEN 2 capsules (40 mg total) at bedtime for 23 days. 60 capsule 1  . Evolocumab (REPATHA SURECLICK) 140 MG/ML SOAJ Inject 140 mg into the skin every 14 (fourteen) days. 2 pen 12  . fluticasone (FLONASE) 50 MCG/ACT nasal spray Place 1 spray into both nostrils daily as needed for allergies or rhinitis.    Marland Kitchen levocetirizine (XYZAL) 5 MG tablet Take 1 tablet by mouth daily as needed (allergies).     . magnesium gluconate (MAGONATE) 500 MG tablet Take 500 mg by mouth daily.    . metFORMIN (GLUCOPHAGE) 1000 MG tablet TAKE 1 TABLET BY MOUTH TWICE DAILY WITH MEALS 90 tablet 1  . NONFORMULARY OR COMPOUNDED ITEM Resmed chin strap For nightly use with CPAP mask 1 each 0  . NONFORMULARY OR COMPOUNDED ITEM CPAP mask AirFitT N10 for Her For nightly use with CPAP machine 1 each 0  . nortriptyline (PAMELOR) 10 MG capsule Take 4 capsules (40 mg total) by mouth at bedtime for 3 days, THEN 3 capsules (30 mg total) at bedtime for 3 days, THEN 2 capsules (20 mg total) at bedtime for 3 days, THEN 1 capsule (10 mg total) at bedtime for 7 days. 34 capsule 0  . omega-3 acid ethyl esters (LOVAZA) 1 g capsule TAKE 2 CAPSULES BY MOUTH TWICE DAILY 120 capsule 3  . valsartan (DIOVAN) 160 MG tablet Take 1 tablet (160 mg total) by mouth daily. 90 tablet 1  . vitamin B-12 (CYANOCOBALAMIN) 500 MCG tablet Take 500 mcg by mouth daily.     No current facility-administered medications for this visit.    Allergies  Allergen Reactions  . Fenofibrate Other (See Comments)    Facial flushing       Objective:  BP 126/75   Pulse 84   Wt 236 lb (107 kg)   LMP 01/22/2016 (Approximate)   BMI 42.48 kg/m  Gen:  alert, not ill-appearing, no distress, appropriate for age HEENT: head normocephalic without obvious abnormality, conjunctiva and cornea clear, trachea midline Pulm: Normal work of breathing, normal phonation Neuro: alert and oriented x 3, no tremor MSK: extremities atraumatic,  normal gait and station Skin: intact, left cheek appears flushed Psych: appearance casual, cooperative, good eye contact, depressed mood, affect mood-congruent, speech is articulate, and thought processes clear and goal-directed    No results found for this or any previous visit (from the past 72 hour(s)). No  results found.    Assessment and Plan: 47 y.o. female with   .Ahnesty was seen today for follow-up.  Diagnoses and all orders for this visit:  Grief reaction with prolonged bereavement  Depressed mood with feeling of loneliness -     DULoxetine (CYMBALTA) 20 MG capsule; Take 1 capsule (20 mg total) by mouth at bedtime for 7 days, THEN 2 capsules (40 mg total) at bedtime for 23 days.  Trigeminal neuralgia  Moderate episode of recurrent major depressive disorder (HCC) -     DULoxetine (CYMBALTA) 20 MG capsule; Take 1 capsule (20 mg total) by mouth at bedtime for 7 days, THEN 2 capsules (40 mg total) at bedtime for 23 days.  Other orders -     nortriptyline (PAMELOR) 10 MG capsule; Take 4 capsules (40 mg total) by mouth at bedtime for 3 days, THEN 3 capsules (30 mg total) at bedtime for 3 days, THEN 2 capsules (20 mg total) at bedtime for 3 days, THEN 1 capsule (10 mg total) at bedtime for 7 days.   PHQ9=12, mildly moderate; GAD7=8, no acute safety issues Originally started on Pamelor for TN, dose was titrated for mood with no significant improvement of depressive symptoms Since she is now on Lyrica, will start Pamelor taper and cross-taper with Duloxetine Continue to f/u with behavioral health for counseling   Patient education and anticipatory guidance given Patient agrees with treatment plan Follow-up in 1 month or sooner as needed if symptoms worsen or fail to improve  Levonne Hubert PA-C

## 2018-08-20 NOTE — Progress Notes (Signed)
THERAPIST PROGRESS NOTE  Session Time: 11:03 AM to 12:00 PM  Participation Level: Active  Behavioral Response: CasualAlertappropriate  Type of Therapy: Individual Therapy  Treatment Goals addressed:  patient processed through grief, decrease depressive symptoms, Anxiety  Interventions: CBT, Solution Focused, Strength-based, Supportive and Other: strategies to decrease anxiety and panic  Summary: Mary Clarke is a 47 y.o. female who providing update and more information about her medical issues. Shares issues that one side of her face turns red, neurologist doesn't know what it is, thinks an  autoimmune disorder with inflammation and prescribed Lyrica. Doctor started [employer] operational [employer] and plans to take her off, starting her on Cymbalta and sees symptoms of both anxiety and depression. Patient main stressor is diagnosis of fatty liver disease that is going to cause her to make diet changes. Describes herself as a "foody' so it is going to be change which is difficult for her and a challenge. Approaching it by slowly making changes, discussed helpful to not overwhelm herself and how this is the way goals are addressed of taking a step at a time. Explains that she knows this is how to address medical issues and knows how but doesn't want to and with anxiety and depression, not being able to do what she wants doesn't help. Shares that she knows she did it to herself but can't beat up on herself. Reviewed additional symptoms from anxiety episodes such as cold clammy sweating symptoms, could catch her breath. Patient shares her tendency to self-diagnose and how that can play a part in anxiety, catastrophize,fortune telling. Shares big relief from ardiologist, nothing major he has to do. Returned to stress dealing with fatty liver shares that in changing diet is "taking things away making a simple decision more complicated" in figuring out what can she eat. Shared what she believed to be start of anxiety  attack, felt walls coming in, turned off TV,  laid down on bed and was cool and comfortable in room,  closed eyes and engaged in deep breathing. Doesn't know what triggers are. Did read a book later as a form of distraction. Discussed not fighting panic and pateint relates that it is hard to do when she didn't know what it was. Discussed lifestyle changes help with panic(see below). Discussed still feels unanswered health concerns, not more concrete answer, has to deal with challenge of health concerns. Discussed session and patient relates that able to be more in touch with feeling of diet change, and how she used strategies to not have full panic attack. Patient wants to write down some thoughts, journal when things come to her, still wants to write a good-bye letter to her brothers.  Therapist to assess patient current functioning per report.  Viewed that patient cannot find triggers for panic, reviewed current stressors and discussed how the stressors may be related to anxiety and panic, may be just not on surface and not fully conscious.  Discussed needing to make change in diet, patient open up in process feelings related to diet, able to talk about her true feelings that it will be a challenge for her.  Discussed strategy of catching panic early and implementing strategy and an early stage so does not go to full fledged panic.  Reviewed recent start of what seemed to be panic attack and reviewed positive way patient handled, utilize deep breathing and relaxation strategies.  Therapist discussed how negative self talk and physiological arousal play a part in anxiety attacks in addressing these 2 parts help  with symptoms.  Identified that relaxation strategies to help with overactive amygdala, help to calm this part of the brain that is part of fight or flight response, discussed overall lifestyle change that are helpful in decreasing anxiety state such as regular practice of deep relaxation, a regular program  of exercise, and elimination of stimulants including sugars, learning to acknowledge and expressed her feelings, especially anger and sadness, adopting self talking core believes which promote her calmer and more accepting attitude toward life.  Introduced worksheet on deep breathing and discussed mind-body connection that helps in a person being more relaxed.  Provided strength based and supportive intervention  Suicidal/Homicidal: No  Plan: Return again in 3 weeks.2.  Please work with patient on coping strategies for anxiety and depression, processed her grief  Diagnosis: Axis I:   major depressive disorder, recurrent, moderate, prolonged grief reaction, Anxiety     Axis II: No diagnosis    Coolidge Breeze, LCSW 08/20/2018

## 2018-08-22 ENCOUNTER — Ambulatory Visit: Payer: Self-pay | Admitting: Physician Assistant

## 2018-08-23 ENCOUNTER — Ambulatory Visit: Payer: Self-pay | Admitting: Physician Assistant

## 2018-08-24 ENCOUNTER — Encounter: Payer: Self-pay | Admitting: Physician Assistant

## 2018-08-26 ENCOUNTER — Encounter: Payer: Self-pay | Admitting: Physician Assistant

## 2018-08-26 DIAGNOSIS — K760 Fatty (change of) liver, not elsewhere classified: Secondary | ICD-10-CM | POA: Insufficient documentation

## 2018-08-27 ENCOUNTER — Ambulatory Visit (HOSPITAL_COMMUNITY): Payer: BLUE CROSS/BLUE SHIELD | Admitting: Psychiatry

## 2018-08-28 NOTE — Telephone Encounter (Signed)
Called [employer] requesting the status of patient paperwork. -EH/RMA

## 2018-08-29 NOTE — Telephone Encounter (Signed)
Spoke with CSR:  She stated that she will contact Misty StanleyLisa to schedule her for a mask fitting. -EH/RMA

## 2018-09-02 IMAGING — NM NM MISC PROCEDURE
9 series · 54 of 54 positions shown · non-contrast
Comparison: none

[Series 1: wbr_r-proj_st wbr rest · 6.40mm/px · 6 of 64 frames shown]
[frame 6/64]
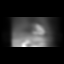
[frame 16/64]
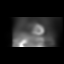
[frame 27/64]
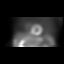
[frame 38/64]
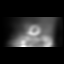
[frame 48/64]
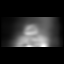
[frame 59/64]
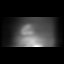

[Series 1: wbr rest · 6.40mm/px · 6 of 64 frames shown]
[frame 6/64  full-range]
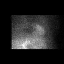
[frame 16/64  full-range]
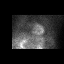
[frame 27/64  full-range]
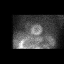
[frame 38/64  full-range]
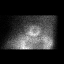
[frame 48/64  full-range]
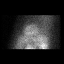
[frame 59/64  full-range]
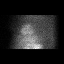

[Series 1: rest sax · 6.4mm · 6.40mm/px · 6 of 23 frames shown]
[frame 2/23]
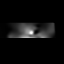
[frame 6/23]
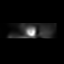
[frame 10/23]
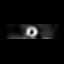
[frame 14/23]
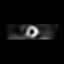
[frame 18/23]
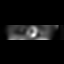
[frame 22/23]
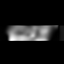

[Series 2: stress sax gs · 6.4mm · 6.40mm/px · 6 of 200 frames shown]
[frame 17/200]
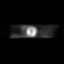
[frame 50/200]
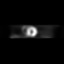
[frame 84/200]
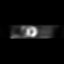
[frame 117/200]
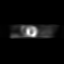
[frame 150/200]
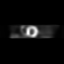
[frame 184/200]
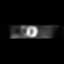

[Series 2: stress sax · 6.4mm · 6.40mm/px · 6 of 25 frames shown]
[frame 3/25]
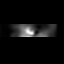
[frame 7/25]
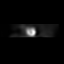
[frame 11/25]
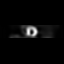
[frame 15/25]
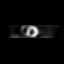
[frame 19/25]
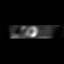
[frame 23/25]
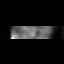

[Series 2: wbr stress-gsp · 6.40mm/px · 6 of 498 frames shown]
[frame 42/498  full-range]
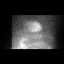
[frame 125/498  full-range]
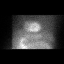
[frame 208/498  full-range]
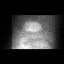
[frame 291/498  full-range]
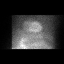
[frame 374/498  full-range]
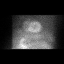
[frame 457/498  full-range]
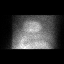

[Series 2: wbr_s-proj_st wbr stress-gsp · 6.40mm/px · 6 of 512 frames shown]
[frame 43/512]
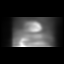
[frame 128/512]
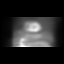
[frame 214/512]
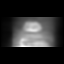
[frame 299/512]
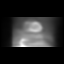
[frame 384/512]
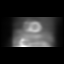
[frame 470/512]
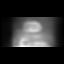

[Series 3: wbr_s-proj_st wbr stress-sum-em · 6.40mm/px · 6 of 64 frames shown]
[frame 6/64]
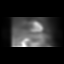
[frame 16/64]
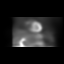
[frame 27/64]
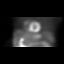
[frame 38/64]
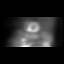
[frame 48/64]
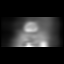
[frame 59/64]
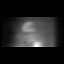

[Series 3: wbr stress-sum-em · 6.40mm/px · 6 of 64 frames shown]
[frame 6/64]
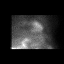
[frame 16/64]
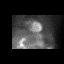
[frame 27/64]
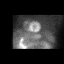
[frame 38/64]
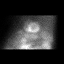
[frame 48/64]
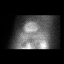
[frame 59/64]
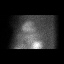

[54 of 54 positions shown; findings below may reference images not displayed]

Canned report from images found in remote index.

Refer to host system for actual result text.

## 2018-09-10 ENCOUNTER — Ambulatory Visit (HOSPITAL_COMMUNITY): Payer: BLUE CROSS/BLUE SHIELD | Admitting: Licensed Clinical Social Worker

## 2018-09-17 ENCOUNTER — Ambulatory Visit: Payer: Self-pay | Admitting: Physician Assistant

## 2018-09-20 ENCOUNTER — Ambulatory Visit (INDEPENDENT_AMBULATORY_CARE_PROVIDER_SITE_OTHER): Payer: BLUE CROSS/BLUE SHIELD | Admitting: Physician Assistant

## 2018-09-20 ENCOUNTER — Encounter: Payer: Self-pay | Admitting: Physician Assistant

## 2018-09-20 VITALS — BP 130/77 | HR 81 | Wt 235.0 lb

## 2018-09-20 DIAGNOSIS — E785 Hyperlipidemia, unspecified: Secondary | ICD-10-CM

## 2018-09-20 DIAGNOSIS — E1169 Type 2 diabetes mellitus with other specified complication: Secondary | ICD-10-CM

## 2018-09-20 DIAGNOSIS — F331 Major depressive disorder, recurrent, moderate: Secondary | ICD-10-CM | POA: Diagnosis not present

## 2018-09-20 DIAGNOSIS — F329 Major depressive disorder, single episode, unspecified: Secondary | ICD-10-CM

## 2018-09-20 DIAGNOSIS — E781 Pure hyperglyceridemia: Secondary | ICD-10-CM

## 2018-09-20 DIAGNOSIS — E119 Type 2 diabetes mellitus without complications: Secondary | ICD-10-CM | POA: Diagnosis not present

## 2018-09-20 DIAGNOSIS — F32A Depression, unspecified: Secondary | ICD-10-CM

## 2018-09-20 DIAGNOSIS — K760 Fatty (change of) liver, not elsewhere classified: Secondary | ICD-10-CM

## 2018-09-20 DIAGNOSIS — G4733 Obstructive sleep apnea (adult) (pediatric): Secondary | ICD-10-CM

## 2018-09-20 LAB — POCT GLYCOSYLATED HEMOGLOBIN (HGB A1C): HBA1C, POC (CONTROLLED DIABETIC RANGE): 6.6 % (ref 0.0–7.0)

## 2018-09-20 MED ORDER — NONFORMULARY OR COMPOUNDED ITEM: 0 refills | Status: AC

## 2018-09-20 MED ORDER — DULOXETINE HCL 40 MG PO CPEP: 40.0000 mg | ORAL_CAPSULE | Freq: Every day | ORAL | 0 refills | Status: DC

## 2018-09-20 NOTE — Patient Instructions (Signed)
Diabetes Preventive Care: - annual foot exam  - annual dilated eye exam with an eye doctor - self foot exams at least weekly - pneumonia vaccine once (booster in 5 years and at age 47) - annual influenza vaccine - twice yearly dental cleanings and yearly exam - goal blood pressure <140/90, ideally <130/80 - LDL cholesterol <70 - A1C <1.6<7.0 - body mass index (BMI) <25.0 - follow-up every 3 months if your A1C is not at goal - follow-up every 6 months if diabetes is well controlled  Nonalcoholic Fatty Liver Disease Diet Nonalcoholic fatty liver disease is a condition that causes fat to accumulate in and around the liver. The disease makes it harder for the liver to work the way that it should. Following a healthy diet can help to keep nonalcoholic fatty liver disease under control. It can also help to prevent or improve conditions that are associated with the disease, such as heart disease, diabetes, high blood pressure, and abnormal cholesterol levels. Along with regular exercise, this diet:  Promotes weight loss.  Helps to control blood sugar levels.  Helps to improve the way that the body uses insulin.  What do I need to know about this diet?  Use the glycemic index (GI) to plan your meals. The index tells you how quickly a food will raise your blood sugar. Choose low-GI foods. These foods take a longer time to raise blood sugar.  Keep track of how many calories you take in. Eating the right amount of calories will help you to achieve a healthy weight.  You may want to follow a Mediterranean diet. This diet includes a lot of vegetables, lean meats or fish, whole grains, fruits, and healthy oils and fats. What foods can I eat? Grains Whole grains, such as whole-wheat or whole-grain breads, crackers, tortillas, cereals, and pasta. Stone-ground whole wheat. Pumpernickel bread. Unsweetened oatmeal. Bulgur. Barley. Quinoa. Brown or wild rice. Corn or whole-wheat flour  tortillas. Vegetables Lettuce. Spinach. Peas. Beets. Cauliflower. Cabbage. Broccoli. Carrots. Tomatoes. Squash. Eggplant. Herbs. Peppers. Onions. Cucumbers. Brussels sprouts. Yams and sweet potatoes. Beans. Lentils. Fruits Bananas. Apples. Oranges. Grapes. Papaya. Mango. Pomegranate. Kiwi. Grapefruit. Cherries. Meats and Other Protein Sources Seafood and shellfish. Lean meats. Poultry. Tofu. Dairy Low-fat or fat-free dairy products, such as yogurt, cottage cheese, and cheese. Beverages Water. Sugar-free drinks. Tea. Coffee. Low-fat or skim milk. Milk alternatives, such as soy or almond milk. Real fruit juice. Condiments Mustard. Relish. Low-fat, low-sugar ketchup and barbecue sauce. Low-fat or fat-free mayonnaise. Sweets and Desserts Sugar-free sweets. Fats and Oils Avocado. Canola or olive oil. Nuts and nut butters. Seeds. The items listed above may not be a complete list of recommended foods or beverages. Contact your dietitian for more options. What foods are not recommended? Palm oil and coconut oil. Processed foods. Fried foods. Sweetened drinks, such as sweet tea, milkshakes, snow cones, iced sweet drinks, and sodas. Alcohol. Sweets. Foods that contain a lot of salt or sodium. The items listed above may not be a complete list of foods and beverages to avoid. Contact your dietitian for more information. This information is not intended to replace advice given to you by your health care provider. Make sure you discuss any questions you have with your health care provider. Document Released: 02/10/2015 Document Revised: 03/03/2016 Document Reviewed: 10/21/2014 Elsevier Interactive Patient Education  [employer]2018 Elsevier Inc.

## 2018-09-20 NOTE — Progress Notes (Signed)
HPI:                                                                Mary Clarke is a 47 y.o. female who [facility] to [facility] today for depression follow-up  Since last OV: Switched from Pamelor to Cymbalta. She self-titrated to 40 mg of Cymbalta and has been on this dose for about 2 weeks Reports "big positive change" noticeable in the last 2 weeks. States friends have also noticed an improvement She is still having sleep difficulties, but would like to try dosing her Cymbalta in the morning to see if this is helpful She is still following with counselor  She has still not heard anything from [facility] regarding CPAP supplies  Depression screen [facility] 2/9 09/20/2018 08/20/2018 04/05/2018 08/03/2017 11/17/2016  Decreased Interest 1 2 0 1 1  Down, Depressed, Hopeless 1 2 2 2 2   PHQ - 2 Score 2 4 2 3 3   Altered sleeping 3 3 3 2 3   Tired, decreased energy 1 3 3 3 3   Change in appetite 3 0 0 1 0  Feeling bad or failure about yourself  0 1 0 0 0  Trouble concentrating 0 1 0 1 1  Moving slowly or fidgety/restless 0 0 2 0 0  Suicidal thoughts 0 0 0 0 0  PHQ-9 Score 9 12 10 10 10   Difficult doing work/chores Somewhat difficult Very difficult Very difficult - -    GAD 7 : Generalized Anxiety Score 09/20/2018 08/20/2018 04/05/2018 11/17/2016  Nervous, Anxious, on Edge 1 2 1 1   Control/stop worrying 1 0 0 2  Worry too much - different things 0 0 0 2  Trouble relaxing 1 3 3 3   Restless 0 1 2 0  Easily annoyed or irritable 1 3 1 3   Afraid - awful might happen 0 0 0 0  Total GAD 7 Score 4 9 7 11   Anxiety Difficulty - Somewhat difficult Very difficult -      Past Medical History:  Diagnosis Date   Allergy    Anxiety    Chronic bronchitis (HCC)    per pt stopped when I quit smoking   Depression    Diabetes mellitus without complication (HCC)    Elevated calcitonin level 08/15/2017   Heart murmur    Hyperlipidemia    Hypertension     PCOS (polycystic ovarian syndrome)    Severe mitral regurgitation by prior echocardiogram 01/04/2017   Sleep apnea    Past Surgical History:  Procedure Laterality Date   CHOLECYSTECTOMY     RIGHT/LEFT HEART CATH AND CORONARY ANGIOGRAPHY Right 04/07/2017   Procedure: Right/Left Heart Cath and Coronary Angiography;  Surgeon: Lyn Records, MD;  Location: [facility] INVASIVE CV LAB;  Service: Cardiovascular;  Laterality: Right;   TEE WITHOUT CARDIOVERSION N/A 04/07/2017   Procedure: TRANSESOPHAGEAL ECHOCARDIOGRAM (TEE);  Surgeon: Lewayne Bunting, MD;  Location: [facility] ENDOSCOPY;  Service: Cardiovascular;  Laterality: N/A;   Social History   Tobacco Use   Smoking status: Former Smoker    Packs/day: 1.00    Years: 15.00    Pack years: 15.00    Types: Cigarettes    Last attempt to quit: 06/11/2015    Years since quitting: 3.2  Smokeless tobacco: Never Used   Tobacco comment: 2nd hand smoke exposure current  Substance Use Topics   Alcohol use: No   family history includes Aneurysm in her brother; Diabetes in her brother and brother; Heart attack (age of onset: 42) in her brother; Heart attack (age of onset: 51) in her father; Heart attack (age of onset: 42) in her mother; Hypertension in her brother and mother; Lung cancer in her brother; Stroke in her brother; Thyroid disease in her brother.    ROS: negative except as noted in the HPI  Medications: Current Outpatient Medications  Medication Sig Dispense Refill   aspirin 81 MG chewable tablet Chew 81 mg by mouth once.     atorvastatin (LIPITOR) 40 MG tablet Take 1 tablet (40 mg total) by mouth at bedtime. 90 tablet 1   DULoxetine 40 MG CPEP Take 40 mg by mouth daily for 7 days. 90 capsule 0   Evolocumab (REPATHA SURECLICK) 140 MG/ML SOAJ Inject 140 mg into the skin every 14 (fourteen) days. 2 pen 12   fluticasone (FLONASE) 50 MCG/ACT nasal spray Place 1 spray into both nostrils daily as needed for allergies or rhinitis.      levocetirizine (XYZAL) 5 MG tablet Take 1 tablet by mouth daily as needed (allergies).      magnesium gluconate (MAGONATE) 500 MG tablet Take 500 mg by mouth daily.     metFORMIN (GLUCOPHAGE) 1000 MG tablet TAKE 1 TABLET BY MOUTH TWICE DAILY WITH MEALS 90 tablet 1   NONFORMULARY OR COMPOUNDED ITEM Resmed chin strap For nightly use with CPAP mask 1 each 0   NONFORMULARY OR COMPOUNDED ITEM CPAP mask AirFit N10 for Her For nightly use with CPAP machine 1 each 0   omega-3 acid ethyl esters (LOVAZA) 1 g capsule TAKE 2 CAPSULES BY MOUTH TWICE DAILY 120 capsule 3   valsartan (DIOVAN) 160 MG tablet Take 1 tablet (160 mg total) by mouth daily. 90 tablet 1   vitamin B-12 (CYANOCOBALAMIN) 500 MCG tablet Take 500 mcg by mouth daily.     No current facility-administered medications for this visit.    Allergies  Allergen Reactions   Fenofibrate Other (See Comments)    Facial flushing       Objective:  BP 130/77    Pulse 81    Wt 235 lb (106.6 kg)    LMP 01/22/2016 (Approximate)    BMI 42.30 kg/m  Gen:  alert, not ill-appearing, no distress, appropriate for age, obese female HEENT: head normocephalic without obvious abnormality, conjunctiva and cornea clear, trachea midline Pulm: Normal work of breathing, normal phonation, clear to auscultation bilaterally, no wheezes, rales or rhonchi CV: Normal rate, regular rhythm, s1 and s2 distinct, no murmurs, clicks or rubs  Neuro: alert and oriented x 3, no tremor MSK: extremities atraumatic, normal gait and station Skin: intact, no rashes on exposed skin, no jaundice, no cyanosis Psych: well-groomed, cooperative, good eye contact, depressed mood, affect mood-congruent, speech is articulate, and thought processes clear and goal-directed    No results found for this or any previous visit (from the past 72 hour(s)). No results found.  The 10-year ASCVD risk score Denman George DC Jr., et al., 2013) is: 5.1%   Values used to calculate the score:      Age: 22 years     Sex: Female     Is Non-Hispanic African American: No     Diabetic: Yes     Tobacco smoker: No     Systolic Blood Pressure: 130  mmHg     Is BP treated: Yes     HDL Cholesterol: 24 mg/dL     Total Cholesterol: 144 mg/dL   Assessment and Plan: 47 y.o. female with   .Mary Clarke was seen today for medication management.  Diagnoses and all orders for this visit:  Type 2 diabetes mellitus without complication, without long-term current use of insulin (HCC) -     POCT HgB A1C  Depressed mood with feeling of loneliness -     DULoxetine 40 MG CPEP; Take 40 mg by mouth daily for 7 days.  Moderate episode of recurrent major depressive disorder (HCC) -     DULoxetine 40 MG CPEP; Take 40 mg by mouth daily for 7 days.  Hyperlipidemia associated with type 2 diabetes mellitus (HCC) -     Lipid Panel w/reflex Direct LDL  Type 2 diabetes mellitus with hypertriglyceridemia (HCC) -     Lipid Panel w/reflex Direct LDL  NAFL (nonalcoholic fatty liver)  OSA (obstructive sleep apnea) -     NONFORMULARY OR COMPOUNDED ITEM; Resmed chin strap For nightly use with CPAP mask -     NONFORMULARY OR COMPOUNDED ITEM; CPAP mask AirFit N10 for Her For nightly use with CPAP machine   MDD PHQ9=9, no acute safety issues Partial response to Duloxetine. Plan to continue 40 mg dose for the next 2 months Cont CBT monthly  OSA - history of severe OSA with severe nocturnal hypoxia - [facility] - noncompliance with CPAP due to mask discomfort - new orders placed for AirFit N10 mask with chin strap - based on last visit with Pulmonology recommend CPAP set to 10 cm H2O with humidified supplemental O2 - will email [facility] to confirm receipt of orders   Patient education and anticipatory guidance given Patient agrees with treatment plan Follow-up in 2 months for depression or sooner as needed if symptoms worsen or fail to improve  Levonne Hubertharley E.  Maylee Bare PA-C

## 2018-09-24 ENCOUNTER — Other Ambulatory Visit: Payer: Self-pay

## 2018-09-24 MED ORDER — DULOXETINE HCL 20 MG PO CPEP: 20.0000 mg | ORAL_CAPSULE | Freq: Two times a day (BID) | ORAL | 0 refills | Status: DC

## 2018-09-26 ENCOUNTER — Other Ambulatory Visit: Payer: Self-pay | Admitting: Physician Assistant

## 2018-09-26 DIAGNOSIS — E1169 Type 2 diabetes mellitus with other specified complication: Secondary | ICD-10-CM | POA: Diagnosis not present

## 2018-09-26 DIAGNOSIS — E781 Pure hyperglyceridemia: Secondary | ICD-10-CM | POA: Diagnosis not present

## 2018-09-26 DIAGNOSIS — E785 Hyperlipidemia, unspecified: Secondary | ICD-10-CM | POA: Diagnosis not present

## 2018-09-27 LAB — LIPID PANEL W/REFLEX DIRECT LDL
Cholesterol: 85 mg/dL (ref <200)
HDL: 26 mg/dL — ABNORMAL LOW (ref >50)
LDL Cholesterol (Calc): 29 mg/dL (calc)
Non-HDL Cholesterol (Calc): 59 mg/dL (calc) (ref <130)
Total CHOL/HDL Ratio: 3.3 (calc) (ref <5.0)
Triglycerides: 259 mg/dL — ABNORMAL HIGH (ref <150)

## 2018-09-28 ENCOUNTER — Telehealth: Payer: Self-pay | Admitting: Physician Assistant

## 2018-09-28 NOTE — Telephone Encounter (Signed)
Please contact patient and find out if she has been contacted by [facility]. Standley DakinsJames Cain said he would be getting her records from [facility] I have forwarded you his email to follow-up with him if needed

## 2018-10-01 NOTE — Telephone Encounter (Signed)
Left vm for pt to return call to clinic -EH/RMA  

## 2018-10-04 NOTE — Telephone Encounter (Signed)
Pt left vm stating that [facility] did reach out to her.  She said that they are not going to be able to help her due to lack of transportation.  She [place] and rent a mask. She is not able to travel that far at this time. -EH/RMA

## 2018-10-04 NOTE — Telephone Encounter (Signed)
2ns attempt -EH/RMA

## 2018-10-04 NOTE — Telephone Encounter (Signed)
I have forwarded this to Standley DakinsJames Cain to see if we can get around this issue

## 2018-10-05 ENCOUNTER — Ambulatory Visit: Payer: BLUE CROSS/BLUE SHIELD | Admitting: Physician Assistant

## 2018-10-07 NOTE — Telephone Encounter (Signed)
Mary DakinsJames Clarke reports they will send an [facility] representative to Mary Clarke's house. This should have been offered to her.

## 2018-11-05 ENCOUNTER — Other Ambulatory Visit: Payer: Self-pay

## 2018-11-05 MED ORDER — EVOLOCUMAB 140 MG/ML ~~LOC~~ SOAJ: 140.0000 mg | SUBCUTANEOUS | 12 refills | Status: DC

## 2018-11-14 ENCOUNTER — Other Ambulatory Visit: Payer: Self-pay | Admitting: Physician Assistant

## 2018-11-14 ENCOUNTER — Other Ambulatory Visit: Payer: Self-pay

## 2018-11-14 DIAGNOSIS — E781 Pure hyperglyceridemia: Secondary | ICD-10-CM

## 2018-11-14 DIAGNOSIS — E1169 Type 2 diabetes mellitus with other specified complication: Secondary | ICD-10-CM

## 2018-11-14 MED ORDER — OMEGA-3-ACID ETHYL ESTERS 1 G PO CAPS: 2.0000 | ORAL_CAPSULE | Freq: Two times a day (BID) | ORAL | 3 refills | Status: DC

## 2018-11-21 ENCOUNTER — Ambulatory Visit: Payer: Self-pay | Admitting: Physician Assistant

## 2018-11-27 ENCOUNTER — Other Ambulatory Visit: Payer: Self-pay | Admitting: Physician Assistant

## 2018-12-03 ENCOUNTER — Telehealth: Payer: Self-pay

## 2018-12-03 MED ORDER — EVOLOCUMAB 140 MG/ML ~~LOC~~ SOAJ: 140.0000 mg | SUBCUTANEOUS | 12 refills | Status: AC

## 2018-12-03 NOTE — Telephone Encounter (Signed)
Called and left pt a detailed msg regarding the pa for repatha was approved until 11/22/19 and sent a refill to [employer]

## 2019-01-01 ENCOUNTER — Other Ambulatory Visit: Payer: Self-pay | Admitting: Physician Assistant

## 2019-01-01 DIAGNOSIS — E1159 Type 2 diabetes mellitus with other circulatory complications: Secondary | ICD-10-CM

## 2019-01-01 DIAGNOSIS — E1169 Type 2 diabetes mellitus with other specified complication: Secondary | ICD-10-CM

## 2019-01-01 DIAGNOSIS — I10 Essential (primary) hypertension: Secondary | ICD-10-CM

## 2019-01-01 DIAGNOSIS — I152 Hypertension secondary to endocrine disorders: Secondary | ICD-10-CM

## 2019-01-16 ENCOUNTER — Other Ambulatory Visit: Payer: Self-pay | Admitting: Physician Assistant

## 2019-01-16 DIAGNOSIS — E781 Pure hyperglyceridemia: Secondary | ICD-10-CM

## 2019-01-16 DIAGNOSIS — E1169 Type 2 diabetes mellitus with other specified complication: Secondary | ICD-10-CM

## 2019-02-01 ENCOUNTER — Telehealth: Payer: Self-pay | Admitting: Physician Assistant

## 2019-02-01 DIAGNOSIS — E781 Pure hyperglyceridemia: Secondary | ICD-10-CM

## 2019-02-01 NOTE — Telephone Encounter (Signed)
Received fax from [employer] that [employer] requires a PA. Information has been sent to the insurance company. Awaiting determination.   

## 2019-02-04 MED ORDER — ICOSAPENT ETHYL 1 G PO CAPS: 2.0000 | ORAL_CAPSULE | Freq: Two times a day (BID) | ORAL | 1 refills | Status: AC

## 2019-02-04 NOTE — Telephone Encounter (Signed)
Changed omega 3 from [employer] to Vascepa She should take 2 capsules twice a day with food Please inform patient reason for change was her insurance

## 2019-02-04 NOTE — Telephone Encounter (Signed)
I received a fax from [employer] that Omega 3/Lovaza is not the preferred medication and the preferred is Vascepa. Please advise.

## 2019-02-05 NOTE — Telephone Encounter (Signed)
Patient has been advised of the medication change and I am waiting on PA for insurance.

## 2019-02-07 NOTE — Telephone Encounter (Signed)
Message from [facility]) Effective from 02/05/2019 through 02/04/2020.  Pharmacy aware.

## 2019-02-16 ENCOUNTER — Other Ambulatory Visit: Payer: Self-pay | Admitting: Physician Assistant

## 2019-02-16 DIAGNOSIS — E1169 Type 2 diabetes mellitus with other specified complication: Secondary | ICD-10-CM

## 2019-03-08 ENCOUNTER — Other Ambulatory Visit: Payer: Self-pay | Admitting: Physician Assistant

## 2019-03-08 DIAGNOSIS — E1159 Type 2 diabetes mellitus with other circulatory complications: Secondary | ICD-10-CM

## 2019-03-08 DIAGNOSIS — I152 Hypertension secondary to endocrine disorders: Secondary | ICD-10-CM

## 2019-04-21 DIAGNOSIS — R0789 Other chest pain: Secondary | ICD-10-CM | POA: Diagnosis not present

## 2019-04-21 DIAGNOSIS — M549 Dorsalgia, unspecified: Secondary | ICD-10-CM | POA: Diagnosis not present

## 2019-04-21 DIAGNOSIS — I4510 Unspecified right bundle-branch block: Secondary | ICD-10-CM | POA: Diagnosis not present

## 2019-04-21 DIAGNOSIS — E282 Polycystic ovarian syndrome: Secondary | ICD-10-CM | POA: Diagnosis not present

## 2019-04-21 DIAGNOSIS — R61 Generalized hyperhidrosis: Secondary | ICD-10-CM | POA: Diagnosis not present

## 2019-04-21 DIAGNOSIS — R079 Chest pain, unspecified: Secondary | ICD-10-CM | POA: Diagnosis not present

## 2019-04-21 DIAGNOSIS — I152 Hypertension secondary to endocrine disorders: Secondary | ICD-10-CM | POA: Diagnosis not present

## 2019-04-21 DIAGNOSIS — R0602 Shortness of breath: Secondary | ICD-10-CM | POA: Diagnosis not present

## 2019-04-21 DIAGNOSIS — E1169 Type 2 diabetes mellitus with other specified complication: Secondary | ICD-10-CM | POA: Diagnosis not present

## 2019-04-22 DIAGNOSIS — E785 Hyperlipidemia, unspecified: Secondary | ICD-10-CM | POA: Diagnosis not present

## 2019-04-22 DIAGNOSIS — E282 Polycystic ovarian syndrome: Secondary | ICD-10-CM | POA: Diagnosis not present

## 2019-04-22 DIAGNOSIS — I152 Hypertension secondary to endocrine disorders: Secondary | ICD-10-CM | POA: Diagnosis not present

## 2019-04-22 DIAGNOSIS — R079 Chest pain, unspecified: Secondary | ICD-10-CM | POA: Diagnosis not present

## 2019-04-22 DIAGNOSIS — E1169 Type 2 diabetes mellitus with other specified complication: Secondary | ICD-10-CM | POA: Diagnosis not present

## 2019-04-25 DIAGNOSIS — F411 Generalized anxiety disorder: Secondary | ICD-10-CM | POA: Diagnosis not present

## 2019-04-25 DIAGNOSIS — I10 Essential (primary) hypertension: Secondary | ICD-10-CM | POA: Diagnosis not present

## 2019-04-25 DIAGNOSIS — E118 Type 2 diabetes mellitus with unspecified complications: Secondary | ICD-10-CM | POA: Diagnosis not present

## 2019-04-25 DIAGNOSIS — E782 Mixed hyperlipidemia: Secondary | ICD-10-CM | POA: Diagnosis not present

## 2019-05-21 DIAGNOSIS — R200 Anesthesia of skin: Secondary | ICD-10-CM | POA: Diagnosis not present

## 2019-05-27 IMAGING — CT CT NECK W/ CM
4 of 5 series · 14 of 33 positions shown, 16 images · IV contrast (iopamidol)
Comparison: None.

CLINICAL DATA: 46 y/o  F; left neck fullness.

EXAM:
CT NECK WITH CONTRAST
TECHNIQUE: Multidetector CT imaging of the neck was performed using the
standard protocol following the bolus administration of intravenous
contrast.
CONTRAST:  75mL [id number] IOPAMIDOL (2X8NGT-6AA) INJECTION 61%

[Series 3: axial neck soft · axial · 0.42mm/px · z∈[-282,-236]mm · 2 of 117 slices shown]
[im 24/117  soft-tissue]
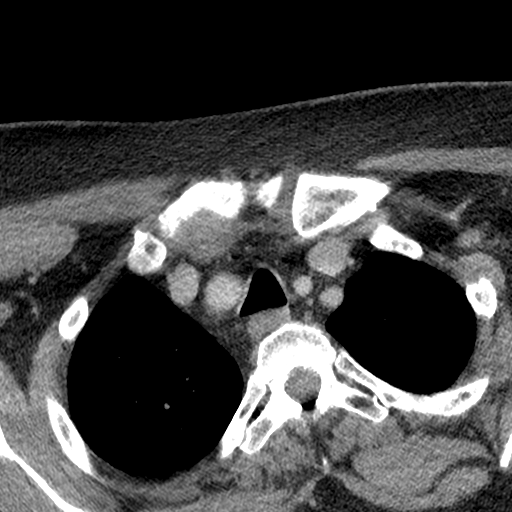
[im 47/117  soft-tissue]
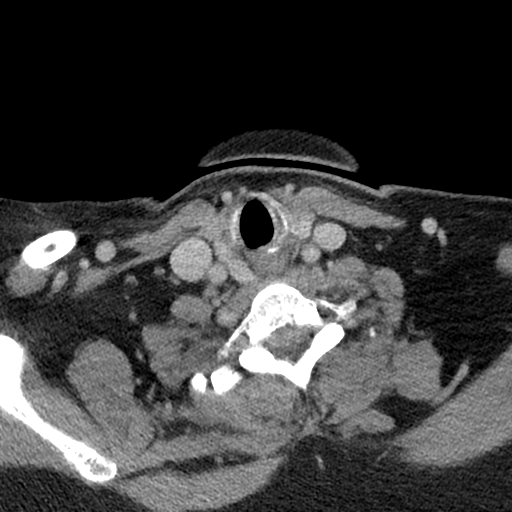

[Series 7: sag neck · sagittal · 0.42mm/px · 5 of 80 slices shown, 6 images]
[im 27/80  bone]
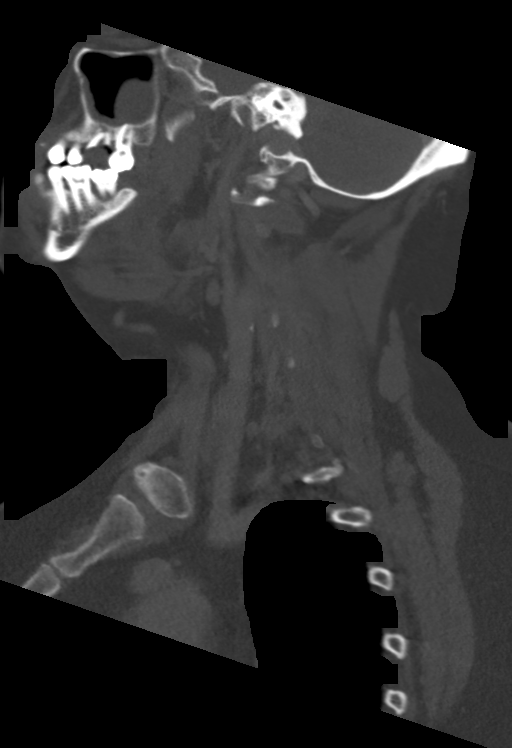
[im 33/80  bone]
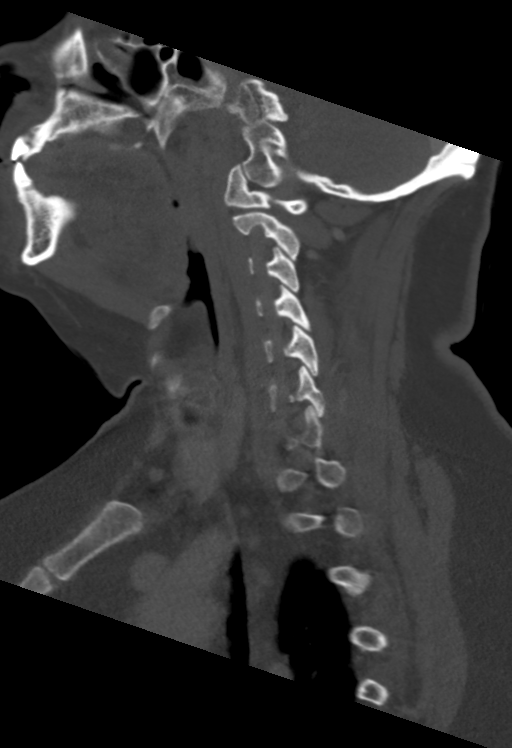
[im 40/80  soft-tissue]
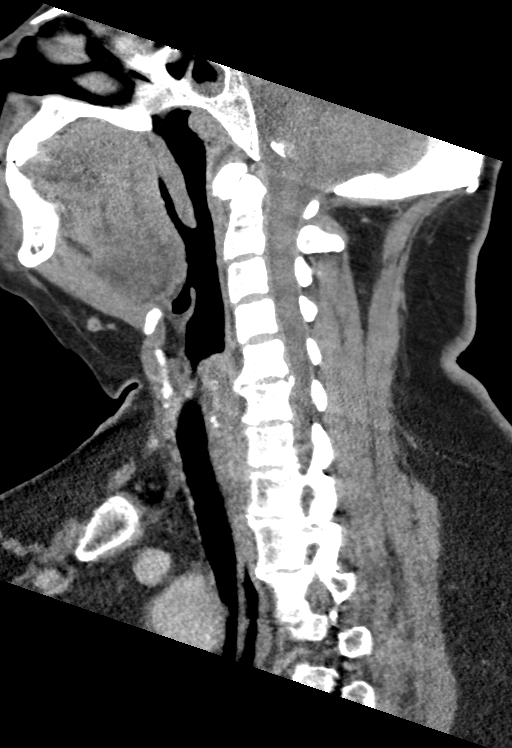
[im 40/80  bone]
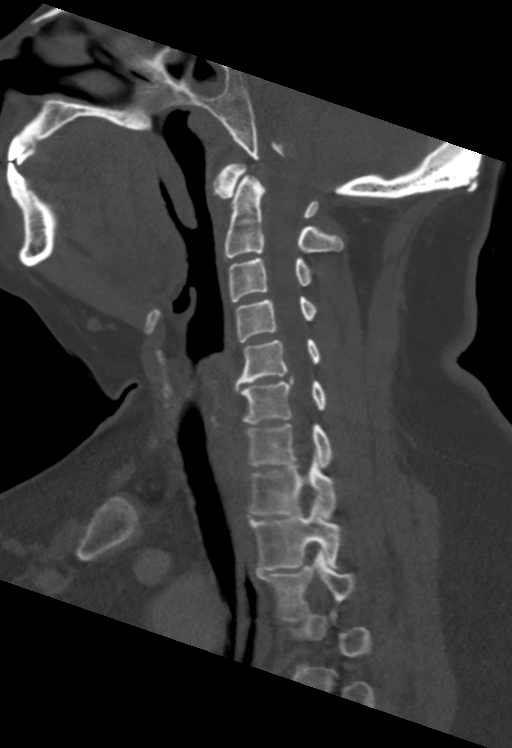
[im 47/80  bone]
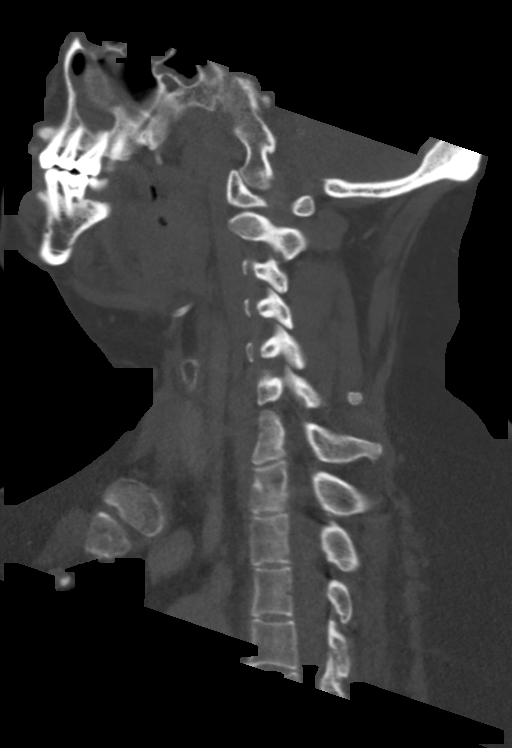
[im 53/80  bone]
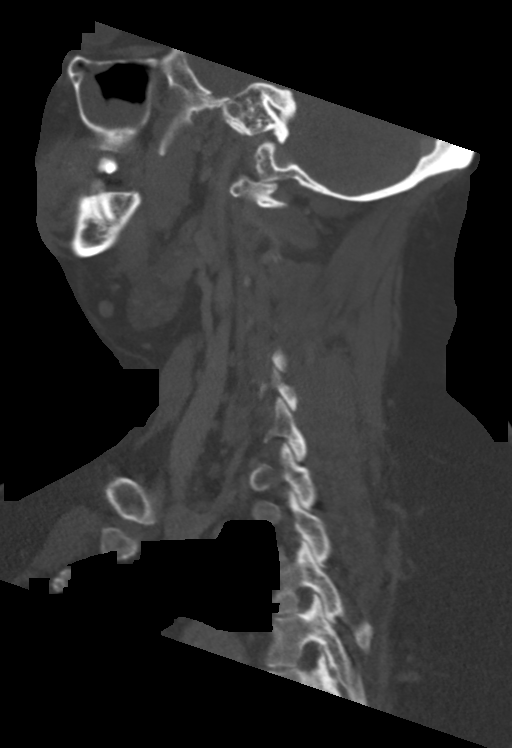

[Series 8: cor neck · coronal · 0.41mm/px · 3 of 101 slices shown]
[im 26/101  bone]
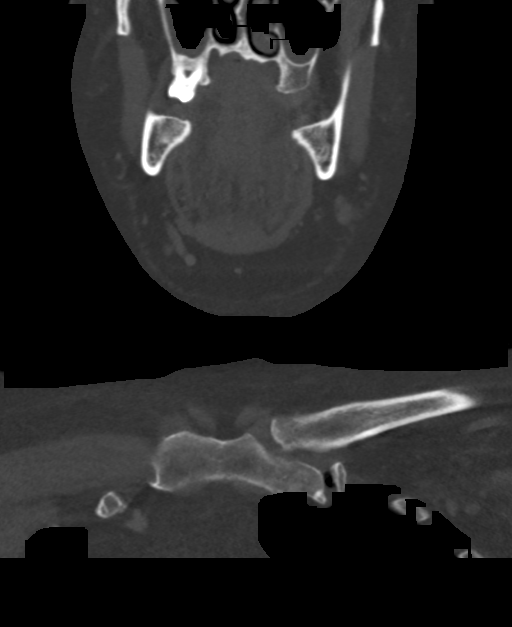
[im 42/101  bone]
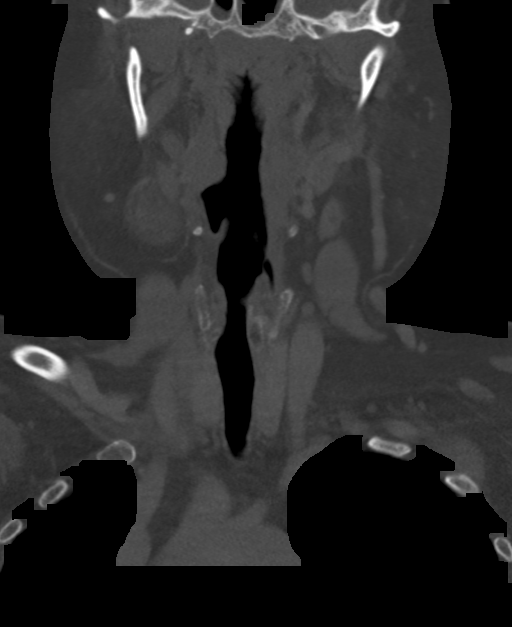
[im 59/101  bone]
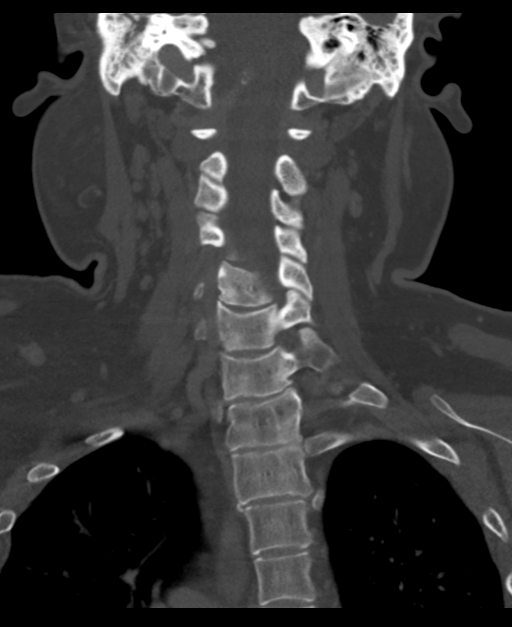

[Series 9: orthogonal ax · axial · 0.39mm/px · z∈[-318,-172]mm · 4 of 130 slices shown, 5 images]
[im 26/130  soft-tissue]
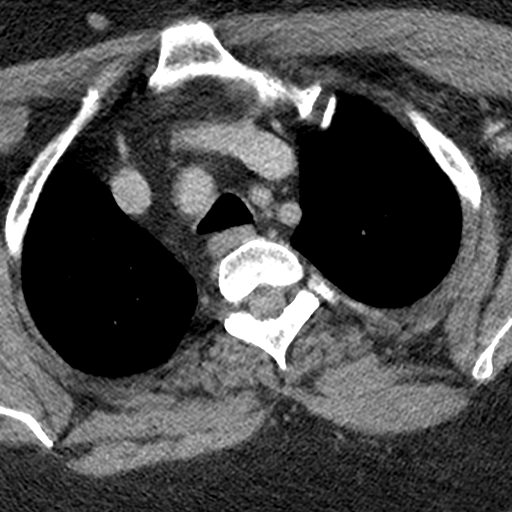
[im 26/130  bone]
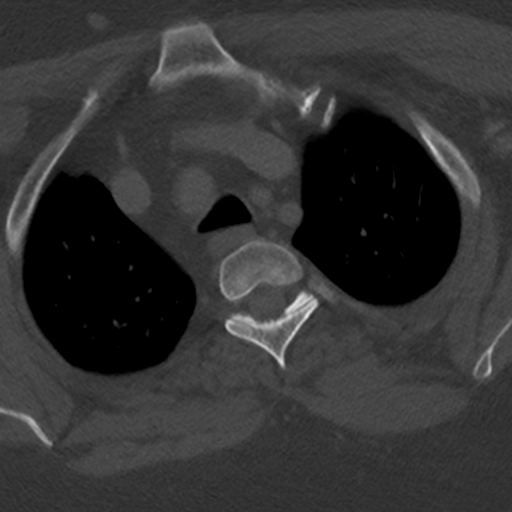
[im 52/130  bone]
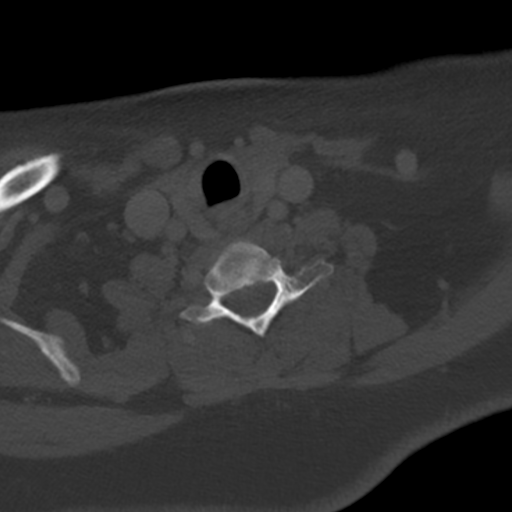
[im 78/130  bone]
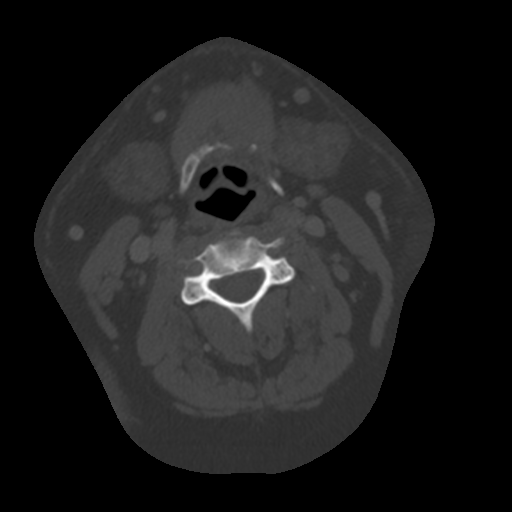
[im 104/130  bone]
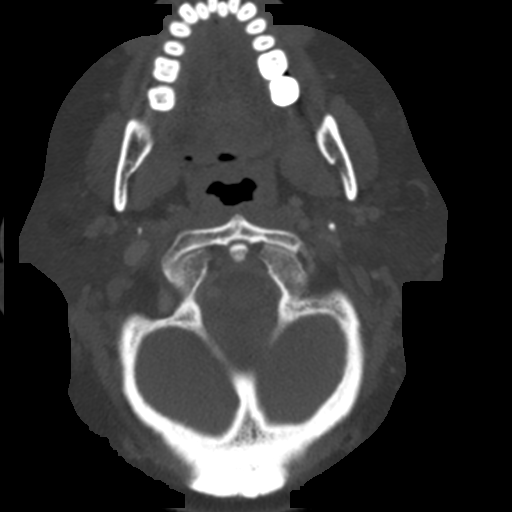

[14 of 33 positions shown; findings below may reference images not displayed]

FINDINGS: Pharynx and larynx: Normal. No mass or swelling.

Salivary glands: No inflammation, mass, or stone.

Thyroid: Normal.

Lymph nodes: None enlarged or abnormal density.

Vascular: Negative.

Limited intracranial: Negative.

Visualized orbits: Negative.

Mastoids and visualized paranasal sinuses: Moderate diffuse
paranasal sinus mucosal thickening and bilateral mastoid air cell
opacification.

Skeleton: C5-6 moderate discogenic degenerative changes with loss of
disc space height, is calcified disc bulge, and anterior endplate
marginal osteophytes. No high-grade bony canal stenosis.

Upper chest: Negative.

Other: None.
IMPRESSION: 1. No mass or inflammatory process of the neck identified. No
findings to account for neck fullness.
2. Cervical spondylosis with moderate discogenic degenerative
changes C5-6.
3. Moderate paranasal sinus disease and bilateral mastoid
opacification.

By: Carolinita Echanove M.D.

## 2019-06-18 DIAGNOSIS — R200 Anesthesia of skin: Secondary | ICD-10-CM | POA: Diagnosis not present

## 2019-06-26 DIAGNOSIS — E118 Type 2 diabetes mellitus with unspecified complications: Secondary | ICD-10-CM | POA: Diagnosis not present

## 2019-06-26 DIAGNOSIS — F411 Generalized anxiety disorder: Secondary | ICD-10-CM | POA: Diagnosis not present

## 2019-06-26 DIAGNOSIS — R200 Anesthesia of skin: Secondary | ICD-10-CM | POA: Diagnosis not present

## 2019-06-26 DIAGNOSIS — I10 Essential (primary) hypertension: Secondary | ICD-10-CM | POA: Diagnosis not present

## 2019-06-26 DIAGNOSIS — G5603 Carpal tunnel syndrome, bilateral upper limbs: Secondary | ICD-10-CM | POA: Diagnosis not present

## 2019-06-26 DIAGNOSIS — E782 Mixed hyperlipidemia: Secondary | ICD-10-CM | POA: Diagnosis not present

## 2019-06-26 DIAGNOSIS — E1165 Type 2 diabetes mellitus with hyperglycemia: Secondary | ICD-10-CM | POA: Diagnosis not present

## 2019-07-03 DIAGNOSIS — G5603 Carpal tunnel syndrome, bilateral upper limbs: Secondary | ICD-10-CM | POA: Diagnosis not present

## 2019-07-18 DIAGNOSIS — E538 Deficiency of other specified B group vitamins: Secondary | ICD-10-CM | POA: Diagnosis not present

## 2019-07-24 DIAGNOSIS — E538 Deficiency of other specified B group vitamins: Secondary | ICD-10-CM | POA: Diagnosis not present

## 2019-08-01 DIAGNOSIS — E538 Deficiency of other specified B group vitamins: Secondary | ICD-10-CM | POA: Diagnosis not present

## 2019-08-08 DIAGNOSIS — E538 Deficiency of other specified B group vitamins: Secondary | ICD-10-CM | POA: Diagnosis not present

## 2019-09-13 DIAGNOSIS — E538 Deficiency of other specified B group vitamins: Secondary | ICD-10-CM | POA: Diagnosis not present

## 2019-09-26 DIAGNOSIS — H9313 Tinnitus, bilateral: Secondary | ICD-10-CM | POA: Diagnosis not present

## 2019-09-26 DIAGNOSIS — E118 Type 2 diabetes mellitus with unspecified complications: Secondary | ICD-10-CM | POA: Diagnosis not present

## 2019-09-26 DIAGNOSIS — R42 Dizziness and giddiness: Secondary | ICD-10-CM | POA: Diagnosis not present

## 2019-09-26 DIAGNOSIS — E538 Deficiency of other specified B group vitamins: Secondary | ICD-10-CM | POA: Diagnosis not present

## 2019-09-26 DIAGNOSIS — E1165 Type 2 diabetes mellitus with hyperglycemia: Secondary | ICD-10-CM | POA: Diagnosis not present
